# Patient Record
Sex: Female | Born: 2000
Health system: Southern US, Community
[De-identification: ages and names within clinical notes are randomized; demographics above are authoritative.]

## PROBLEM LIST (undated history)

## (undated) DIAGNOSIS — F419 Anxiety disorder, unspecified: Secondary | ICD-10-CM

## (undated) DIAGNOSIS — R011 Cardiac murmur, unspecified: Secondary | ICD-10-CM

## (undated) DIAGNOSIS — K2970 Gastritis, unspecified, without bleeding: Secondary | ICD-10-CM

## (undated) HISTORY — DX: Anxiety disorder, unspecified: F41.9

## (undated) HISTORY — DX: Gastritis, unspecified, without bleeding: K29.70

## (undated) HISTORY — PX: TONSILLECTOMY: SUR1361

---
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
## 2020-01-08 ENCOUNTER — Ambulatory Visit (INDEPENDENT_AMBULATORY_CARE_PROVIDER_SITE_OTHER): Payer: 59 | Admitting: Advanced Practice Midwife

## 2020-01-08 ENCOUNTER — Encounter: Payer: Self-pay | Admitting: Advanced Practice Midwife

## 2020-01-08 ENCOUNTER — Other Ambulatory Visit: Payer: Self-pay

## 2020-01-08 VITALS — BP 113/59 | HR 78 | Wt 160.0 lb

## 2020-01-08 DIAGNOSIS — Z331 Pregnant state, incidental: Secondary | ICD-10-CM

## 2020-01-08 DIAGNOSIS — Z3403 Encounter for supervision of normal first pregnancy, third trimester: Secondary | ICD-10-CM

## 2020-01-08 DIAGNOSIS — F1290 Cannabis use, unspecified, uncomplicated: Secondary | ICD-10-CM

## 2020-01-08 DIAGNOSIS — Z3A30 30 weeks gestation of pregnancy: Secondary | ICD-10-CM

## 2020-01-08 DIAGNOSIS — Z1389 Encounter for screening for other disorder: Secondary | ICD-10-CM

## 2020-01-08 LAB — POCT URINALYSIS DIPSTICK OB
Blood, UA: NEGATIVE
Glucose, UA: NEGATIVE
Ketones, UA: NEGATIVE
Leukocytes, UA: NEGATIVE
Nitrite, UA: NEGATIVE
POC,PROTEIN,UA: NEGATIVE

## 2020-01-08 NOTE — Patient Instructions (Signed)
Latasha Thompson, I greatly value your feedback.  If you receive a survey following your visit with [place] today, we appreciate you taking the time to fill it out.  Thanks, Philipp Deputy, CNM   [facility] ([address] [place], [place] [address]) Entrance C, located off of [address] valet parking  Go to [email or web address] to register for FREE online childbirth classes   Call the office [phone]) or go to [facility] if:  You begin to have strong, frequent contractions  Your water breaks.  Sometimes it is a big gush of fluid, sometimes it is just a trickle that keeps getting your panties wet or running down your legs  You have vaginal bleeding.  It is normal to have a small amount of spotting if your cervix was checked.   You don't feel your baby moving like normal.  If you don't, get you something to eat and drink and lay down and focus on feeling your baby move.  You should feel at least 10 movements in 2 hours.  If you don't, you should call the office or go to [facility].    Tdap Vaccine  It is recommended that you get the Tdap vaccine during the third trimester of EACH pregnancy to help protect your baby from getting pertussis (whooping cough)  27-36 weeks is the BEST time to do this so that you can pass the protection on to your baby. During pregnancy is better than after pregnancy, but if you are unable to get it during pregnancy it will be offered at the hospital.   You can get this vaccine with [place], at the health department, your family doctor, or some local pharmacies  Everyone who will be around your baby should also be up-to-date on their vaccines before the baby comes. Adults (who are not pregnant) only need 1 dose of Tdap during adulthood.   [place] Pediatricians/Family Doctors:  [facility] [phone]            [facility] [phone]                 [facility] [phone]  (usually not accepting new patients unless you have family there already, you are always welcome to call and ask)       [facility] Department [phone]       [facility] Pediatricians/Family Doctors:   [facility]: [phone]  [facility]: [phone]  [facility]: [phone]  [facility] Doctors:   [facility]: [phone]   [facility]: [phone]  [facility] Doctors:  [facility]: [phone]   Home Blood Pressure Monitoring for Patients   Your provider has recommended that you check your blood pressure (BP) at least once a week at home. If you do not have a blood pressure cuff at home, one will be provided for you. Contact your provider if you have not received your monitor within 1 week.   Helpful Tips for Accurate Home Blood Pressure Checks  . Don't smoke, exercise, or drink caffeine 30 minutes before checking your BP . Use the restroom before checking your BP (a full bladder can raise your pressure) . Relax in a comfortable upright chair . Feet on the ground . Left arm resting comfortably on a flat surface at the level of your heart . Legs uncrossed . Back supported . Sit quietly and don't talk . Place the cuff on your bare  arm . Adjust snuggly, so that only two fingertips can fit between your skin and the top of the cuff . Check 2 readings separated by at least one minute . Keep a log of your BP readings . For a visual, please reference this diagram: http://ccnc.care/bpdiagram  Provider Name: [facility]     Phone: [phone]  Zone 1: ALL CLEAR  Continue to monitor your symptoms:  . BP reading is less than 140 (top number) or less than 90 (bottom number)  . No right upper stomach pain . No headaches or seeing spots . No feeling nauseated or throwing up . No swelling in face and hands  Zone 2: CAUTION Call your doctor's office for  any of the following:  . BP reading is greater than 140 (top number) or greater than 90 (bottom number)  . Stomach pain under your ribs in the middle or right side . Headaches or seeing spots . Feeling nauseated or throwing up . Swelling in face and hands  Zone 3: EMERGENCY  Seek immediate medical care if you have any of the following:  . BP reading is greater than160 (top number) or greater than 110 (bottom number) . Severe headaches not improving with Tylenol . Serious difficulty catching your breath . Any worsening symptoms from Zone 2   Third Trimester of Pregnancy The third trimester is from week 29 through week 42, months 7 through 9. The third trimester is a time when the fetus is growing rapidly. At the end of the ninth month, the fetus is about 20 inches in length and weighs 6-10 pounds.  BODY CHANGES Your body goes through many changes during pregnancy. The changes vary from woman to woman.   Your weight will continue to increase. You can expect to gain 25-35 pounds (11-16 kg) by the end of the pregnancy.  You may begin to get stretch marks on your hips, abdomen, and breasts.  You may urinate more often because the fetus is moving lower into your pelvis and pressing on your bladder.  You may develop or continue to have heartburn as a result of your pregnancy.  You may develop constipation because certain hormones are causing the muscles that push waste through your intestines to slow down.  You may develop hemorrhoids or swollen, bulging veins (varicose veins).  You may have pelvic pain because of the weight gain and pregnancy hormones relaxing your joints between the bones in your pelvis. Backaches may result from overexertion of the muscles supporting your posture.  You may have changes in your hair. These can include thickening of your hair, rapid growth, and changes in texture. Some women also have hair loss during or after pregnancy, or hair that feels dry or thin.  Your hair will most likely return to normal after your baby is born.  Your breasts will continue to grow and be tender. A yellow discharge may leak from your breasts called colostrum.  Your belly button may stick out.  You may feel short of breath because of your expanding uterus.  You may notice the fetus "dropping," or moving lower in your abdomen.  You may have a bloody mucus discharge. This usually occurs a few days to a week before labor begins.  Your cervix becomes thin and soft (effaced) near your due date. WHAT TO EXPECT AT YOUR PRENATAL EXAMS  You will have prenatal exams every 2 weeks until week 36. Then, you will have weekly prenatal exams. During a routine prenatal visit:  You  will be weighed to make sure you and the fetus are growing normally.  Your blood pressure is taken.  Your abdomen will be measured to track your baby's growth.  The fetal heartbeat will be listened to.  Any test results from the previous visit will be discussed.  You may have a cervical check near your due date to see if you have effaced. At around 36 weeks, your caregiver will check your cervix. At the same time, your caregiver will also perform a test on the secretions of the vaginal tissue. This test is to determine if a type of bacteria, Group B streptococcus, is present. Your caregiver will explain this further. Your caregiver may ask you:  What your birth plan is.  How you are feeling.  If you are feeling the baby move.  If you have had any abnormal symptoms, such as leaking fluid, bleeding, severe headaches, or abdominal cramping.  If you have any questions. Other tests or screenings that may be performed during your third trimester include:  Blood tests that check for low iron levels (anemia).  Fetal testing to check the health, activity level, and growth of the fetus. Testing is done if you have certain medical conditions or if there are problems during the pregnancy. FALSE  LABOR You may feel small, irregular contractions that eventually go away. These are called Braxton Hicks contractions, or false labor. Contractions may last for hours, days, or even weeks before true labor sets in. If contractions come at regular intervals, intensify, or become painful, it is best to be seen by your caregiver.  SIGNS OF LABOR   Menstrual-like cramps.  Contractions that are 5 minutes apart or less.  Contractions that start on the top of the uterus and spread down to the lower abdomen and back.  A sense of increased pelvic pressure or back pain.  A watery or bloody mucus discharge that comes from the vagina. If you have any of these signs before the 37th week of pregnancy, call your caregiver right away. You need to go to the hospital to get checked immediately. HOME CARE INSTRUCTIONS   Avoid all smoking, herbs, alcohol, and unprescribed drugs. These chemicals affect the formation and growth of the baby.  Follow your caregiver's instructions regarding medicine use. There are medicines that are either safe or unsafe to take during pregnancy.  Exercise only as directed by your caregiver. Experiencing uterine cramps is a good sign to stop exercising.  Continue to eat regular, healthy meals.  Wear a good support bra for breast tenderness.  Do not use hot tubs, steam rooms, or saunas.  Wear your seat belt at all times when driving.  Avoid raw meat, uncooked cheese, cat litter boxes, and soil used by cats. These carry germs that can cause birth defects in the baby.  Take your prenatal vitamins.  Try taking a stool softener (if your caregiver approves) if you develop constipation. Eat more high-fiber foods, such as fresh vegetables or fruit and whole grains. Drink plenty of fluids to keep your urine clear or pale yellow.  Take warm sitz baths to soothe any pain or discomfort caused by hemorrhoids. Use hemorrhoid cream if your caregiver approves.  If you develop varicose  veins, wear support hose. Elevate your feet for 15 minutes, 3-4 times a day. Limit salt in your diet.  Avoid heavy lifting, wear low heal shoes, and practice good posture.  Rest a lot with your legs elevated if you have leg cramps or low back  pain.  Visit your dentist if you have not gone during your pregnancy. Use a soft toothbrush to brush your teeth and be gentle when you floss.  A sexual relationship may be continued unless your caregiver directs you otherwise.  Do not travel far distances unless it is absolutely necessary and only with the approval of your caregiver.  Take prenatal classes to understand, practice, and ask questions about the labor and delivery.  Make a trial run to the hospital.  Pack your hospital bag.  Prepare the baby's nursery.  Continue to go to all your prenatal visits as directed by your caregiver. SEEK MEDICAL CARE IF:  You are unsure if you are in labor or if your water has broken.  You have dizziness.  You have mild pelvic cramps, pelvic pressure, or nagging pain in your abdominal area.  You have persistent nausea, vomiting, or diarrhea.  You have a bad smelling vaginal discharge.  You have pain with urination. SEEK IMMEDIATE MEDICAL CARE IF:   You have a fever.  You are leaking fluid from your vagina.  You have spotting or bleeding from your vagina.  You have severe abdominal cramping or pain.  You have rapid weight loss or gain.  You have shortness of breath with chest pain.  You notice sudden or extreme swelling of your face, hands, ankles, feet, or legs.  You have not felt your baby move in over an hour.  You have severe headaches that do not go away with medicine.  You have vision changes. Document Released: 01/16/2001 Document Revised: 01/27/2013 Document Reviewed: 03/25/2012 [employer] Patient Information 2015 [place], [place]. This information is not intended to replace advice given to you by your health care provider. Make  sure you discuss any questions you have with your health care provider.

## 2020-01-08 NOTE — Progress Notes (Signed)
   LOW-RISK PREGNANCY VISIT Patient name: Latasha Thompson MRN [id number]  Date of birth: 2000/07/16 Chief Complaint:   Routine Prenatal Visit  History of Present Illness:   Latasha Thompson is a 19 y.o. G1P0 female at [redacted]w[redacted]d with an Estimated Date of Delivery: 03/17/20 being seen today for ongoing management of a low-risk pregnancy.  Today she reports doing well; has stopped THC use. Contractions: Not present. Vag. Bleeding: None.  Movement: Present. denies leaking of fluid. Review of Systems:   Pertinent items are noted in HPI Denies abnormal vaginal discharge w/ itching/odor/irritation, headaches, visual changes, shortness of breath, chest pain, abdominal pain, severe nausea/vomiting, or problems with urination or bowel movements unless otherwise stated above. Pertinent History Reviewed:  Reviewed past medical,surgical, social, obstetrical and family history.  Reviewed problem list, medications and allergies. Physical Assessment:   Vitals:   01/08/20 0938  BP: (!) 113/59  Pulse: 78  Weight: 160 lb (72.6 kg)  Body mass index is 27.46 kg/m.        Physical Examination:   General appearance: Well appearing, and in no distress  Mental status: Alert, oriented to person, place, and time  Skin: Warm & dry  Cardiovascular: Normal heart rate noted  Respiratory: Normal respiratory effort, no distress  Abdomen: Soft, gravid, nontender  Pelvic: Cervical exam deferred         Extremities: Edema: None  Fetal Status: Fetal Heart Rate (bpm): 140 Fundal Height: 30 cm Movement: Present    Results for orders placed or performed in visit on 01/08/20 (from the past 24 hour(s))  POC Urinalysis Dipstick OB   Collection Time: 01/08/20  9:39 AM  Result Value Ref Range   Color, UA     Clarity, UA     Glucose, UA Negative Negative   Bilirubin, UA     Ketones, UA neg    Spec Grav, UA     Blood, UA neg    pH, UA     POC,PROTEIN,UA Negative Negative, Trace, Small (1+), Moderate (2+), Large (3+), 4+    Urobilinogen, UA     Nitrite, UA neg    Leukocytes, UA Negative Negative   Appearance     Odor      Assessment & Plan:  1) Low-risk pregnancy G1P0 at [redacted]w[redacted]d with an Estimated Date of Delivery: 03/17/20   2) Hx THC use, has stopped; will get UDS today    Meds: No orders of the defined types were placed in this encounter.  Labs/procedures today: UDS  Plan:  Continue routine obstetrical care   Reviewed: Preterm labor symptoms and general obstetric precautions including but not limited to vaginal bleeding, contractions, leaking of fluid and fetal movement were reviewed in detail with the patient.  All questions were answered. Has home bp cuff.  Check bp weekly, let us know if >140/90.   Follow-up: Return in about 2 weeks (around 01/22/2020) for LROB, in person.  Orders Placed This Encounter  Procedures  . Pain Management Screening Profile (10S)  . POC Urinalysis Dipstick OB   Arabella Merles [facility] 01/08/2020 10:04 AM

## 2020-01-13 LAB — PMP SCREEN PROFILE (10S), URINE
Amphetamine Scrn, Ur: NEGATIVE ng/mL
BARBITURATE SCREEN URINE: NEGATIVE ng/mL
BENZODIAZEPINE SCREEN, URINE: NEGATIVE ng/mL
CANNABINOIDS UR QL SCN: NEGATIVE ng/mL
Cocaine (Metab) Scrn, Ur: NEGATIVE ng/mL
Creatinine(Crt), U: 29.1 mg/dL (ref 20.0–300.0)
Methadone Screen, Urine: NEGATIVE ng/mL
OXYCODONE+OXYMORPHONE UR QL SCN: NEGATIVE ng/mL
Opiate Scrn, Ur: NEGATIVE ng/mL
Ph of Urine: 8 (ref 4.5–8.9)
Phencyclidine Qn, Ur: NEGATIVE ng/mL
Propoxyphene Scrn, Ur: NEGATIVE ng/mL

## 2020-01-21 ENCOUNTER — Other Ambulatory Visit: Payer: Self-pay

## 2020-01-21 ENCOUNTER — Encounter: Payer: Self-pay | Admitting: Obstetrics & Gynecology

## 2020-01-21 ENCOUNTER — Ambulatory Visit (INDEPENDENT_AMBULATORY_CARE_PROVIDER_SITE_OTHER): Payer: 59 | Admitting: Obstetrics & Gynecology

## 2020-01-21 VITALS — BP 103/55 | HR 71 | Wt 160.0 lb

## 2020-01-21 DIAGNOSIS — Z3403 Encounter for supervision of normal first pregnancy, third trimester: Secondary | ICD-10-CM

## 2020-01-21 DIAGNOSIS — O365931 Maternal care for other known or suspected poor fetal growth, third trimester, fetus 1: Secondary | ICD-10-CM

## 2020-01-21 DIAGNOSIS — Z331 Pregnant state, incidental: Secondary | ICD-10-CM

## 2020-01-21 DIAGNOSIS — Z3A32 32 weeks gestation of pregnancy: Secondary | ICD-10-CM

## 2020-01-21 DIAGNOSIS — Z1389 Encounter for screening for other disorder: Secondary | ICD-10-CM

## 2020-01-21 LAB — POCT URINALYSIS DIPSTICK OB
Blood, UA: NEGATIVE
Glucose, UA: NEGATIVE
Ketones, UA: NEGATIVE
Leukocytes, UA: NEGATIVE
Nitrite, UA: NEGATIVE
POC,PROTEIN,UA: NEGATIVE

## 2020-01-21 NOTE — Progress Notes (Signed)
LOW-RISK PREGNANCY VISIT Patient name: Latasha Thompson MRN [id number]  Date of birth: 2000-07-29 Chief Complaint:   Routine Prenatal Visit  History of Present Illness:   Latasha Thompson is a 19 y.o. G1P0 female at [redacted]w[redacted]d with an Estimated Date of Delivery: 03/17/20 being seen today for ongoing management of a low-risk pregnancy.  Depression screen [facility] 2/9 12/18/2019 09/08/2019 07/14/2019  Decreased Interest 1 2 0  Down, Depressed, Hopeless 0 0 0  PHQ - 2 Score 1 2 0  Altered sleeping 0 0 1  Tired, decreased energy 0 1 1  Change in appetite 0 0 2  Feeling bad or failure about yourself  0 0 0  Trouble concentrating 0 0 0  Moving slowly or fidgety/restless 0 0 0  Suicidal thoughts 0 0 0  PHQ-9 Score 1 3 4   Difficult doing work/chores - - Not difficult at all    Today she reports no complaints. Contractions: Not present. Vag. Bleeding: None.  Movement: Present. denies leaking of fluid. Review of Systems:   Pertinent items are noted in HPI Denies abnormal vaginal discharge w/ itching/odor/irritation, headaches, visual changes, shortness of breath, chest pain, abdominal pain, severe nausea/vomiting, or problems with urination or bowel movements unless otherwise stated above. Pertinent History Reviewed:  Reviewed past medical,surgical, social, obstetrical and family history.  Reviewed problem list, medications and allergies. Physical Assessment:   Vitals:   01/21/20 1110  BP: (!) 103/55  Pulse: 71  Weight: 160 lb (72.6 kg)  Body mass index is 27.46 kg/m.        Physical Examination:   General appearance: Well appearing, and in no distress  Mental status: Alert, oriented to person, place, and time  Skin: Warm & dry  Cardiovascular: Normal heart rate noted  Respiratory: Normal respiratory effort, no distress  Abdomen: Soft, gravid, nontender  Pelvic: Cervical exam deferred         Extremities: Edema: None  Fetal Status: Fetal Heart Rate (bpm): 140 Fundal Height: 28 cm Movement:  Present    Chaperone: Amanda Rash    Results for orders placed or performed in visit on 01/21/20 (from the past 24 hour(s))  POC Urinalysis Dipstick OB   Collection Time: 01/21/20 11:15 AM  Result Value Ref Range   Color, UA     Clarity, UA     Glucose, UA Negative Negative   Bilirubin, UA     Ketones, UA neg    Spec Grav, UA     Blood, UA neg    pH, UA     POC,PROTEIN,UA Negative Negative, Trace, Small (1+), Moderate (2+), Large (3+), 4+   Urobilinogen, UA     Nitrite, UA neg    Leukocytes, UA Negative Negative   Appearance     Odor      Assessment & Plan:  1) Low-risk pregnancy G1P0 at [redacted]w[redacted]d with an Estimated Date of Delivery: 03/17/20   2) Check sonogram next visit for small for gestational age,    Meds: No orders of the defined types were placed in this encounter.  Labs/procedures today:   Plan:  Continue routine obstetrical care  Next visit: prefers in person    Reviewed: Preterm labor symptoms and general obstetric precautions including but not limited to vaginal bleeding, contractions, leaking of fluid and fetal movement were reviewed in detail with the patient.  All questions were answered. has home bp cuff. Rx faxed to . Check bp weekly, let 05/15/20 know if >140/90.   Follow-up: Return in about  2 weeks (around 02/04/2020) for sonogram for EFW, LROB.  Orders Placed This Encounter  Procedures  . US OB Follow Up  . POC Urinalysis Dipstick OB    Lazaro Arms, MD 01/21/2020 11:45 AM

## 2020-02-06 NOTE — L&D Delivery Note (Addendum)
OB/GYN Faculty Practice Delivery Note  Latasha Thompson is a 20 y.o. G1P1001 s/p vaginal delivery at [redacted]w[redacted]d. She was admitted for labor management s/p PROM.   ROM: 17h 48m with light meconium stained fluid GBS Status: negative Maximum Maternal Temperature: 99.29F  Labor Progress: Given no cervical change s/p admission to L&D, pt received cytotec and FB was placed. She was transitioned to pitocin at 0421 on 2/13. At 0645 an IUPC was placed and amnioinfusion was started for recurrent deep variables. She then progressed to complete cervical dilation and had an uncomplicated vaginal delivery as noted below.   Delivery Date/Time: 03/21/20 at 0754 Delivery: Called to room and patient was complete and pushing. Head delivered ROA. No nuchal cord present. Shoulder and body delivered in usual fashion. Infant with spontaneous cry, placed on mother's abdomen, dried and stimulated. Cord clamped x 2 after 1-minute delay, and cut by family member under my direct supervision. Cord blood drawn. Placenta delivered spontaneously with gentle cord traction. Fundus firm with massage and Pitocin. Labia, perineum, vagina, and cervix were inspected, notable for second degree perineal laceration s/p repair in standard fashion with 3-0 vicryl in addition to bilateral hemostatic periurethral lacerations.  Placenta: 3-vessel cord, intact with trailing membranes, sent to L&D Complications: none Lacerations: 2nd degree EBL: 150 ml Analgesia: epidural  Infant: viable female  APGARs 8 & 9  weight 3116 g  I was gloved and present for delivery of infant as noted above. I delivered the placenta and performed the laceration repair as noted above.  Lynnda Shields, MD OB/GYN Fellow, Faculty Practice

## 2020-02-08 ENCOUNTER — Other Ambulatory Visit: Payer: 59

## 2020-02-08 ENCOUNTER — Encounter: Payer: 59 | Admitting: Obstetrics & Gynecology

## 2020-02-17 ENCOUNTER — Ambulatory Visit (INDEPENDENT_AMBULATORY_CARE_PROVIDER_SITE_OTHER): Payer: Medicaid Other

## 2020-02-17 ENCOUNTER — Other Ambulatory Visit: Payer: Self-pay

## 2020-02-17 ENCOUNTER — Ambulatory Visit (INDEPENDENT_AMBULATORY_CARE_PROVIDER_SITE_OTHER): Payer: Medicaid Other | Admitting: Advanced Practice Midwife

## 2020-02-17 VITALS — BP 105/62 | HR 80 | Wt 167.0 lb

## 2020-02-17 DIAGNOSIS — Z3403 Encounter for supervision of normal first pregnancy, third trimester: Secondary | ICD-10-CM

## 2020-02-17 DIAGNOSIS — O365931 Maternal care for other known or suspected poor fetal growth, third trimester, fetus 1: Secondary | ICD-10-CM

## 2020-02-17 DIAGNOSIS — Z3A35 35 weeks gestation of pregnancy: Secondary | ICD-10-CM | POA: Diagnosis not present

## 2020-02-17 DIAGNOSIS — Z1389 Encounter for screening for other disorder: Secondary | ICD-10-CM

## 2020-02-17 DIAGNOSIS — Z331 Pregnant state, incidental: Secondary | ICD-10-CM

## 2020-02-17 LAB — POCT URINALYSIS DIPSTICK OB
Blood, UA: NEGATIVE
Glucose, UA: NEGATIVE
Ketones, UA: NEGATIVE
Leukocytes, UA: NEGATIVE
Nitrite, UA: NEGATIVE
POC,PROTEIN,UA: NEGATIVE

## 2020-02-17 NOTE — Progress Notes (Signed)
   PRENATAL VISIT NOTE  Subjective:  Latasha Thompson is a 20 y.o. G1P0 at [redacted]w[redacted]d being seen today for ongoing prenatal care.  She is currently monitored for the following issues for this low-risk pregnancy and has Nausea and vomiting; Supervision of normal first pregnancy; Marijuana use; and Asymptomatic bacteriuria on their problem list.  Patient reports no complaints.  Contractions: Not present. Vag. Bleeding: None.  Movement: Present. Denies leaking of fluid.   The following portions of the patient's history were reviewed and updated as appropriate: allergies, current medications, past family history, past medical history, past social history, past surgical history and problem list. Problem list updated.  Objective:   Vitals:   02/17/20 1508  BP: 105/62  Pulse: 80  Weight: 167 lb (75.8 kg)    Fetal Status: Fetal Heart Rate (bpm): 120 Fundal Height: 34 cm Movement: Present     General:  Alert, oriented and cooperative. Patient is in no acute distress.  Skin: Skin is warm and dry. No rash noted.   Cardiovascular: Normal heart rate noted  Respiratory: Normal respiratory effort, no problems with respiration noted  Abdomen: Soft, gravid, appropriate for gestational age.  Pain/Pressure: Absent     Pelvic: Cervical exam deferred        Extremities: Normal range of motion.  Edema: None  Mental Status: Normal mood and affect. Normal behavior. Normal judgment and thought content.   Assessment and Plan:  Pregnancy: G1P0 at [redacted]w[redacted]d  1. Encounter for supervision of normal first pregnancy in third trimester - Routine care - GBS and GCC next visit. Reviewed indication for treatment in labor, possible influence on inpatient care  2. Small for dates affecting management of mother, third trimester, fetus 1 - S/p ultrasound today, EFW 2391g (14.5%)  Preterm labor symptoms and general obstetric precautions including but not limited to vaginal bleeding, contractions, leaking of fluid and fetal  movement were reviewed in detail with the patient. Please refer to After Visit Summary for other counseling recommendations.  Return in about 1 week (around 02/24/2020) for 36 week swabs.  No future appointments.  Calvert Cantor, CNM

## 2020-02-17 NOTE — Patient Instructions (Signed)

## 2020-02-17 NOTE — Progress Notes (Signed)
[place] 35+6 wks,cephalic,anterior placenta gr 3,afi 11.2 cm,fhr 138 bpm,EFW 2391 g 14.5%,FL 2.3%

## 2020-02-24 ENCOUNTER — Encounter: Payer: Self-pay | Admitting: Obstetrics and Gynecology

## 2020-02-24 ENCOUNTER — Other Ambulatory Visit: Payer: Self-pay

## 2020-02-24 ENCOUNTER — Ambulatory Visit (INDEPENDENT_AMBULATORY_CARE_PROVIDER_SITE_OTHER): Payer: Medicaid Other | Admitting: Obstetrics and Gynecology

## 2020-02-24 ENCOUNTER — Other Ambulatory Visit (HOSPITAL_COMMUNITY)
Admission: RE | Admit: 2020-02-24 | Discharge: 2020-02-24 | Disposition: A | Discharge status: Home / Self Care | Payer: Medicaid Other | Source: Ambulatory Visit | Attending: Obstetrics and Gynecology | Admitting: Obstetrics and Gynecology

## 2020-02-24 VITALS — BP 109/55 | HR 95 | Wt 168.8 lb

## 2020-02-24 DIAGNOSIS — Z113 Encounter for screening for infections with a predominantly sexual mode of transmission: Secondary | ICD-10-CM | POA: Insufficient documentation

## 2020-02-24 DIAGNOSIS — Z3403 Encounter for supervision of normal first pregnancy, third trimester: Secondary | ICD-10-CM

## 2020-02-24 DIAGNOSIS — Z3A36 36 weeks gestation of pregnancy: Secondary | ICD-10-CM | POA: Insufficient documentation

## 2020-02-24 LAB — OB RESULTS CONSOLE GC/CHLAMYDIA: Gonorrhea: NEGATIVE

## 2020-02-24 NOTE — Patient Instructions (Signed)

## 2020-02-24 NOTE — Progress Notes (Signed)
Subjective:  Latasha Thompson is a 20 y.o. G1P0 at [redacted]w[redacted]d being seen today for ongoing prenatal care.  She is currently monitored for the following issues for this low-risk pregnancy and has Nausea and vomiting; Supervision of normal first pregnancy; Marijuana use; and Asymptomatic bacteriuria on their problem list.  Patient reports general discomforts of pregnancy.  Contractions: Not present. Vag. Bleeding: None.  Movement: Present. Denies leaking of fluid.   The following portions of the patient's history were reviewed and updated as appropriate: allergies, current medications, past family history, past medical history, past social history, past surgical history and problem list. Problem list updated.  Objective:   Vitals:   02/24/20 1429  BP: (!) 109/55  Pulse: 95  Weight: 168 lb 12.8 oz (76.6 kg)    Fetal Status:     Movement: Present     General:  Alert, oriented and cooperative. Patient is in no acute distress.  Skin: Skin is warm and dry. No rash noted.   Cardiovascular: Normal heart rate noted  Respiratory: Normal respiratory effort, no problems with respiration noted  Abdomen: Soft, gravid, appropriate for gestational age. Pain/Pressure: Absent     Pelvic:  Cervical exam performed        Extremities: Normal range of motion.  Edema: None  Mental Status: Normal mood and affect. Normal behavior. Normal judgment and thought content.   Urinalysis:      Assessment and Plan:  Pregnancy: G1P0 at [redacted]w[redacted]d  1. [redacted] weeks gestation of pregnancy  - Culture, beta strep (group b only) - Cervicovaginal ancillary only( [place])  2. Encounter for supervision of normal first pregnancy in third trimester Stable Vaginal cultures Labor precautions  Preterm labor symptoms and general obstetric precautions including but not limited to vaginal bleeding, contractions, leaking of fluid and fetal movement were reviewed in detail with the patient. Please refer to After Visit Summary for other  counseling recommendations.  Return in about 1 week (around 03/02/2020) for OB visit, virtual, any provider.   Hermina Staggers, MD

## 2020-02-26 LAB — CERVICOVAGINAL ANCILLARY ONLY
Chlamydia: NEGATIVE
Comment: NEGATIVE
Comment: NORMAL
Neisseria Gonorrhea: NEGATIVE

## 2020-02-28 LAB — CULTURE, BETA STREP (GROUP B ONLY): Strep Gp B Culture: NEGATIVE

## 2020-03-08 ENCOUNTER — Telehealth: Payer: Self-pay

## 2020-03-08 NOTE — Telephone Encounter (Signed)
Pt called to r/s appt for an earlier time due to pelvic pressure, had experienced some back pain in the last few day

## 2020-03-08 NOTE — Telephone Encounter (Signed)
Returned pt's call. Pt was experiencing increased pressure in her lower pelvic, vaginal area. She has no c/o pain, no contractions, baby is moving as usual with no decreased movements. No bleeding or spotting, no rupture of membranes or leaking fluid. Will be seen in the office 2/2 at 0850. Assured pt that this was normal as the baby moves down into the pelvis and she noticed a significant lowering of her belly. She wants to have her cervix checked for dilation at her appt. She was told that if she had any rupture of membranes, leaking fluid, or timeable contractions, to go to the hospital.

## 2020-03-09 ENCOUNTER — Other Ambulatory Visit: Payer: Self-pay

## 2020-03-09 ENCOUNTER — Encounter: Payer: Medicaid Other | Admitting: Women's Health

## 2020-03-09 ENCOUNTER — Ambulatory Visit (INDEPENDENT_AMBULATORY_CARE_PROVIDER_SITE_OTHER): Payer: Medicaid Other | Admitting: Advanced Practice Midwife

## 2020-03-09 ENCOUNTER — Encounter: Payer: Self-pay | Admitting: Advanced Practice Midwife

## 2020-03-09 VITALS — BP 109/61 | HR 75 | Wt 169.0 lb

## 2020-03-09 DIAGNOSIS — Z3403 Encounter for supervision of normal first pregnancy, third trimester: Secondary | ICD-10-CM

## 2020-03-09 DIAGNOSIS — Z3A38 38 weeks gestation of pregnancy: Secondary | ICD-10-CM

## 2020-03-09 NOTE — Patient Instructions (Signed)

## 2020-03-09 NOTE — Progress Notes (Signed)
   LOW-RISK PREGNANCY VISIT Patient name: Latasha Thompson MRN [id number]  Date of birth: 2001-01-24 Chief Complaint:   Routine Prenatal Visit  History of Present Illness:   Latasha Thompson is a 20 y.o. G1P0 female at [redacted]w[redacted]d with an Estimated Date of Delivery: 03/17/20 being seen today for ongoing management of a low-risk pregnancy.  Today she reports dong well but ready!. Contractions: Not present.  .  Movement: Present. denies leaking of fluid. Review of Systems:   Pertinent items are noted in HPI Denies abnormal vaginal discharge w/ itching/odor/irritation, headaches, visual changes, shortness of breath, chest pain, abdominal pain, severe nausea/vomiting, or problems with urination or bowel movements unless otherwise stated above. Pertinent History Reviewed:  Reviewed past medical,surgical, social, obstetrical and family history.  Reviewed problem list, medications and allergies. Physical Assessment:   Vitals:   03/09/20 0908  BP: 109/61  Pulse: 75  Weight: 169 lb (76.7 kg)  Body mass index is 29.01 kg/m.        Physical Examination:   General appearance: Well appearing, and in no distress  Mental status: Alert, oriented to person, place, and time  Skin: Warm & dry  Cardiovascular: Normal heart rate noted  Respiratory: Normal respiratory effort, no distress  Abdomen: Soft, gravid, nontender  Pelvic: Cervical exam deferred         Extremities: Edema: None  Fetal Status:     Movement: Present    No results found for this or any previous visit (from the past 24 hour(s)).  Assessment & Plan:  1) Low-risk pregnancy G1P0 at [redacted]w[redacted]d with an Estimated Date of Delivery: 03/17/20     Meds: No orders of the defined types were placed in this encounter.  Labs/procedures today: none  Plan:  Continue routine obstetrical care; NST with visit at 40.1wks if no labor, then will schedule IOL for 41.0wks   Reviewed: Term labor symptoms and general obstetric precautions including but not limited  to vaginal bleeding, contractions, leaking of fluid and fetal movement were reviewed in detail with the patient.  All questions were answered. Didn't ask about home bp cuff. Check bp weekly, let us know if >140/90.   Follow-up: Return in 9 days (on 03/18/2020) for LROB, NST, in person.  No orders of the defined types were placed in this encounter.  Arabella Merles CNM 03/09/2020 9:37 AM

## 2020-03-18 ENCOUNTER — Other Ambulatory Visit: Payer: Self-pay

## 2020-03-18 ENCOUNTER — Encounter: Payer: Medicaid Other | Admitting: Obstetrics & Gynecology

## 2020-03-18 ENCOUNTER — Encounter: Payer: Self-pay | Admitting: Obstetrics & Gynecology

## 2020-03-18 ENCOUNTER — Other Ambulatory Visit: Payer: Self-pay | Admitting: Advanced Practice Midwife

## 2020-03-18 ENCOUNTER — Ambulatory Visit (INDEPENDENT_AMBULATORY_CARE_PROVIDER_SITE_OTHER): Payer: Medicaid Other | Admitting: Obstetrics & Gynecology

## 2020-03-18 VITALS — BP 109/65 | HR 80 | Wt 172.0 lb

## 2020-03-18 DIAGNOSIS — Z3A40 40 weeks gestation of pregnancy: Secondary | ICD-10-CM | POA: Diagnosis not present

## 2020-03-18 DIAGNOSIS — O480 Post-term pregnancy: Secondary | ICD-10-CM | POA: Diagnosis not present

## 2020-03-18 NOTE — Progress Notes (Signed)
   LOW-RISK PREGNANCY VISIT Patient name: Latasha Thompson MRN [id number]  Date of birth: February 01, 2001 Chief Complaint:   Routine Prenatal Visit and Non-stress Test  History of Present Illness:   Latasha Thompson is a 20 y.o. G1P0 female at 104w1d with an Estimated Date of Delivery: 03/17/20 being seen today for ongoing management of a low-risk pregnancy.  Depression screen [facility] 2/9 12/18/2019 09/08/2019 07/14/2019  Decreased Interest 1 2 0  Down, Depressed, Hopeless 0 0 0  PHQ - 2 Score 1 2 0  Altered sleeping 0 0 1  Tired, decreased energy 0 1 1  Change in appetite 0 0 2  Feeling bad or failure about yourself  0 0 0  Trouble concentrating 0 0 0  Moving slowly or fidgety/restless 0 0 0  Suicidal thoughts 0 0 0  PHQ-9 Score 1 3 4   Difficult doing work/chores - - Not difficult at all    Today she reports no complaints. Contractions: Not present. Vag. Bleeding: None.  Movement: Present. denies leaking of fluid. Review of Systems:   Pertinent items are noted in HPI Denies abnormal vaginal discharge w/ itching/odor/irritation, headaches, visual changes, shortness of breath, chest pain, abdominal pain, severe nausea/vomiting, or problems with urination or bowel movements unless otherwise stated above. Pertinent History Reviewed:  Reviewed past medical,surgical, social, obstetrical and family history.  Reviewed problem list, medications and allergies. Physical Assessment:   Vitals:   03/18/20 1225  BP: 109/65  Pulse: 80  Weight: 172 lb (78 kg)  Body mass index is 29.52 kg/m.        Physical Examination:   General appearance: Well appearing, and in no distress  Mental status: Alert, oriented to person, place, and time  Skin: Warm & dry  Cardiovascular: Normal heart rate noted  Respiratory: Normal respiratory effort, no distress  Abdomen: Soft, gravid, nontender  Pelvic: Cervical exam deferred         Extremities: Edema: None  Fetal Status:     Movement: Present    Chaperone: N/A   No  results found for this or any previous visit (from the past 24 hour(s)).  Assessment & Plan:  1) Low-risk pregnancy G1P0 at [redacted]w[redacted]d with an Estimated Date of Delivery: 03/17/20   2) Impending post dates, IOL 2/26@2345    Meds: No orders of the defined types were placed in this encounter.  Labs/procedures today: nst   VICKYE ASTORINO is at [redacted]w[redacted]d Estimated Date of Delivery: 03/17/20  NST being performed due to post dates  Today the NST is Reactive  Fetal Monitoring:  Baseline: 135 bpm, Variability: Good {> 6 bpm), Accelerations: Reactive and Decelerations: Absent   reactive  The accelerations are >15 bpm and more than 2 in 20 minutes  Final diagnosis:  Reactive NST  05/15/20, MD     Plan:  Continue routine obstetrical care  Next visit: prefers in person    Reviewed: Term labor symptoms and general obstetric precautions including but not limited to vaginal bleeding, contractions, leaking of fluid and fetal movement were reviewed in detail with the patient.  All questions were answered. Has home bp cuff. Rx faxed to . Check bp weekly, let Lazaro Arms know if >140/90.   Follow-up: No follow-ups on file.  No future appointments.  No orders of the defined types were placed in this encounter.  [place]  03/18/2020 1:01 PM

## 2020-03-18 NOTE — Progress Notes (Signed)
   Induction Assessment Scheduling Form: Fax to [facility]:  [phone]  Latasha Thompson                                                                                   DOB:  2000-10-29                                                            MRN:  [id number]                                                                     Phone #:   demographics                         Provider:  [facility]  GP:  G1P0                                                            Estimated Date of Delivery: 03/17/20  Dating Criteria: 1st trimester scan    Medical Indications for induction:  Post dates Admission Date/Time:  03/23/20@2345  Gestational age on admission:  [redacted]w[redacted]d   Filed Weights   03/18/20 1225  Weight: 172 lb (78 kg)   HIV:  Non Reactive (11/12 0839) GBS: Negative/-- (01/19 1600)  Cervical exam was deferred   Method of induction(proposed):  Cytotec/foley/pitocin   Scheduling Provider Signature:  Lazaro Arms, MD                                            Today's Date:  03/18/2020

## 2020-03-20 ENCOUNTER — Encounter (HOSPITAL_COMMUNITY): Payer: Self-pay | Admitting: Family Medicine

## 2020-03-20 ENCOUNTER — Inpatient Hospital Stay (HOSPITAL_COMMUNITY)
Admission: AD | Admit: 2020-03-20 | Discharge: 2020-03-23 | DRG: 806 | Disposition: A | Discharge status: Home / Self Care | Payer: Medicaid Other | Attending: Family Medicine | Admitting: Family Medicine

## 2020-03-20 ENCOUNTER — Other Ambulatory Visit: Payer: Self-pay

## 2020-03-20 DIAGNOSIS — Z20822 Contact with and (suspected) exposure to covid-19: Secondary | ICD-10-CM | POA: Diagnosis present

## 2020-03-20 DIAGNOSIS — Z3A40 40 weeks gestation of pregnancy: Secondary | ICD-10-CM | POA: Diagnosis not present

## 2020-03-20 DIAGNOSIS — Z3400 Encounter for supervision of normal first pregnancy, unspecified trimester: Secondary | ICD-10-CM

## 2020-03-20 DIAGNOSIS — F1290 Cannabis use, unspecified, uncomplicated: Secondary | ICD-10-CM | POA: Diagnosis present

## 2020-03-20 DIAGNOSIS — Z3403 Encounter for supervision of normal first pregnancy, third trimester: Secondary | ICD-10-CM

## 2020-03-20 DIAGNOSIS — O480 Post-term pregnancy: Secondary | ICD-10-CM | POA: Diagnosis present

## 2020-03-20 DIAGNOSIS — R8271 Bacteriuria: Secondary | ICD-10-CM | POA: Diagnosis present

## 2020-03-20 DIAGNOSIS — O4202 Full-term premature rupture of membranes, onset of labor within 24 hours of rupture: Secondary | ICD-10-CM | POA: Diagnosis not present

## 2020-03-20 DIAGNOSIS — O4292 Full-term premature rupture of membranes, unspecified as to length of time between rupture and onset of labor: Principal | ICD-10-CM | POA: Diagnosis present

## 2020-03-20 DIAGNOSIS — O99324 Drug use complicating childbirth: Secondary | ICD-10-CM | POA: Diagnosis present

## 2020-03-20 LAB — RESP PANEL BY RT-PCR (FLU A&B, COVID) ARPGX2
Influenza A by PCR: NEGATIVE
Influenza B by PCR: NEGATIVE
SARS Coronavirus 2 by RT PCR: NEGATIVE

## 2020-03-20 LAB — CBC
HCT: 34.6 % — ABNORMAL LOW (ref 36.0–46.0)
Hemoglobin: 11 g/dL — ABNORMAL LOW (ref 12.0–15.0)
MCH: 28.6 pg (ref 26.0–34.0)
MCHC: 31.8 g/dL (ref 30.0–36.0)
MCV: 89.9 fL (ref 80.0–100.0)
Platelets: 236 10*3/uL (ref 150–400)
RBC: 3.85 MIL/uL — ABNORMAL LOW (ref 3.87–5.11)
RDW: 14 % (ref 11.5–15.5)
WBC: 7.5 10*3/uL (ref 4.0–10.5)
nRBC: 0 % (ref 0.0–0.2)

## 2020-03-20 LAB — TYPE AND SCREEN
ABO/RH(D): A POS
Antibody Screen: NEGATIVE

## 2020-03-20 LAB — POCT FERN TEST: POCT Fern Test: POSITIVE

## 2020-03-20 MED ORDER — OXYCODONE-ACETAMINOPHEN 5-325 MG PO TABS
2.0000 | ORAL_TABLET | ORAL | Status: DC | PRN
Start: 2020-03-20 — End: 2020-03-21

## 2020-03-20 MED ORDER — OXYTOCIN-SODIUM CHLORIDE 30-0.9 UT/500ML-% IV SOLN: 2.5000 [IU]/h | INTRAVENOUS | Status: DC

## 2020-03-20 MED ORDER — MISOPROSTOL 50MCG HALF TABLET
50.0000 ug | ORAL_TABLET | Freq: Once | ORAL | Status: AC
  Administered 2020-03-20: 50 ug via BUCCAL
  Filled 2020-03-20: qty 1

## 2020-03-20 MED ORDER — OXYTOCIN BOLUS FROM INFUSION
333.0000 mL | Freq: Once | INTRAVENOUS | Status: AC
  Administered 2020-03-21: 333 mL/h via INTRAVENOUS

## 2020-03-20 MED ORDER — EPHEDRINE 5 MG/ML INJ: 10.0000 mg | INTRAVENOUS | Status: DC | PRN

## 2020-03-20 MED ORDER — FENTANYL-BUPIVACAINE-NACL 0.5-0.125-0.9 MG/250ML-% EP SOLN
12.0000 mL/h | EPIDURAL | Status: DC | PRN
Start: 2020-03-20 — End: 2020-03-21
  Filled 2020-03-20: qty 250

## 2020-03-20 MED ORDER — ACETAMINOPHEN 325 MG PO TABS: 650.0000 mg | ORAL_TABLET | ORAL | Status: DC | PRN

## 2020-03-20 MED ORDER — FENTANYL CITRATE (PF) 100 MCG/2ML IJ SOLN
50.0000 ug | INTRAMUSCULAR | Status: DC | PRN
  Administered 2020-03-21 (×2): 100 ug via INTRAVENOUS
  Filled 2020-03-20 (×2): qty 2

## 2020-03-20 MED ORDER — OXYTOCIN-SODIUM CHLORIDE 30-0.9 UT/500ML-% IV SOLN
1.0000 m[IU]/min | INTRAVENOUS | Status: DC
  Administered 2020-03-21: 2 m[IU]/min via INTRAVENOUS
  Filled 2020-03-20: qty 500

## 2020-03-20 MED ORDER — PHENYLEPHRINE 40 MCG/ML (10ML) SYRINGE FOR IV PUSH (FOR BLOOD PRESSURE SUPPORT)
80.0000 ug | PREFILLED_SYRINGE | INTRAVENOUS | Status: DC | PRN
  Administered 2020-03-21: 80 ug via INTRAVENOUS
  Filled 2020-03-20: qty 10

## 2020-03-20 MED ORDER — TERBUTALINE SULFATE 1 MG/ML IJ SOLN: 0.2500 mg | Freq: Once | INTRAMUSCULAR | Status: DC | PRN

## 2020-03-20 MED ORDER — BUTORPHANOL TARTRATE 1 MG/ML IJ SOLN
2.0000 mg | Freq: Once | INTRAMUSCULAR | Status: AC
  Administered 2020-03-20: 2 mg via INTRAVENOUS
  Filled 2020-03-20: qty 2

## 2020-03-20 MED ORDER — OXYCODONE-ACETAMINOPHEN 5-325 MG PO TABS
1.0000 | ORAL_TABLET | ORAL | Status: DC | PRN
Start: 2020-03-20 — End: 2020-03-21

## 2020-03-20 MED ORDER — MISOPROSTOL 25 MCG QUARTER TABLET: 25.0000 ug | ORAL_TABLET | ORAL | Status: DC | PRN

## 2020-03-20 MED ORDER — LACTATED RINGERS IV SOLN
500.0000 mL | Freq: Once | INTRAVENOUS | Status: AC
  Administered 2020-03-21: 500 mL via INTRAVENOUS

## 2020-03-20 MED ORDER — LACTATED RINGERS IV SOLN: INTRAVENOUS | Status: DC

## 2020-03-20 MED ORDER — LACTATED RINGERS IV SOLN: 500.0000 mL | INTRAVENOUS | Status: DC | PRN

## 2020-03-20 MED ORDER — DIPHENHYDRAMINE HCL 50 MG/ML IJ SOLN: 12.5000 mg | INTRAMUSCULAR | Status: DC | PRN

## 2020-03-20 MED ORDER — ONDANSETRON HCL 4 MG/2ML IJ SOLN: 4.0000 mg | Freq: Four times a day (QID) | INTRAMUSCULAR | Status: DC | PRN

## 2020-03-20 MED ORDER — PHENYLEPHRINE 40 MCG/ML (10ML) SYRINGE FOR IV PUSH (FOR BLOOD PRESSURE SUPPORT): 80.0000 ug | PREFILLED_SYRINGE | INTRAVENOUS | Status: DC | PRN

## 2020-03-20 MED ORDER — LIDOCAINE HCL (PF) 1 % IJ SOLN: 30.0000 mL | INTRAMUSCULAR | Status: DC | PRN

## 2020-03-20 MED ORDER — SOD CITRATE-CITRIC ACID 500-334 MG/5ML PO SOLN: 30.0000 mL | ORAL | Status: DC | PRN

## 2020-03-20 NOTE — Progress Notes (Addendum)
Latasha Thompson is a 20 y.o. G1P0 at [redacted]w[redacted]d admitted for PROM @1400  on 2/13.    Subjective: Feeling contractions, but not very painful yet.  Discussed FB insertion and she is agreeable.    Objective: BP 123/64   Pulse 70   Temp 97.6 F (36.4 C) (Oral)   Resp 18   LMP 06/11/2019   SpO2 100%  No intake/output data recorded.  FHT:  FHR: 135 bpm, variability: moderate,  accelerations:  Absent,  decelerations:  Absent UC: Every 2-4 min  SVE:   Dilation: 1 Effacement (%): Thick Station: -2 Exam by:: Shekela Goodridge MD  Labs: Lab Results  Component Value Date   WBC 7.5 03/20/2020   HGB 11.0 (L) 03/20/2020   HCT 34.6 (L) 03/20/2020   MCV 89.9 03/20/2020   PLT 236 03/20/2020    Assessment / Plan: Latasha Thompson is a 20 y.o. G1P0 at [redacted]w[redacted]d here for PROM @1400  on 2/13.  #PROM:  Cervix unchanged this check.  Attempted FB insertion, but did not tolerate.  After dose of Stadol, attempted again, but still unsuccessful (very posterior and did not tolerate further attempts).  Given a dose of Cytotec 50 mcg buccally.  #Pain: Upon patient request #FWB: Cat I #ID: [id number] #MOF: Breast #MOC: POPs #Circ: Yes #THC use in preg: SW consult pp  , MD 03/20/2020, 11:47 PM

## 2020-03-20 NOTE — Progress Notes (Signed)
CNM at bedside to confirm presentation. Pt informed that the ultrasound is considered a limited OB ultrasound and is not intended to be a complete ultrasound exam.  Patient also informed that the ultrasound is not being completed with the intent of assessing for fetal or placental anomalies or any pelvic abnormalities.  Explained that the purpose of today's ultrasound is to assess for  presentation.  Patient acknowledges the purpose of the exam and the limitations of the study.    Vertex presentation confirmed.   Rolm Bookbinder, CNM 03/20/20 3:58 PM

## 2020-03-20 NOTE — MAU Note (Signed)
Latasha Thompson is a 20 y.o. at [redacted]w[redacted]d here in MAU reporting: has been leaking since 1400. Still leaking. Fluid is yellow. No bleeding or contractions. +FM  Onset of complaint: today  Pain score: 0/10  Vitals:   03/20/20 1529  BP: 127/65  Pulse: 69  Resp: 16  Temp: 99.2 F (37.3 C)  SpO2: 100%     FHT:+FM  Lab orders placed from triage: none

## 2020-03-20 NOTE — H&P (Signed)
OBSTETRIC ADMISSION HISTORY AND PHYSICAL  Latasha Thompson is a 20 y.o. female G1P0 with IUP at [redacted]w[redacted]d by LMP presenting for PROM @ 1400 on 03/20/20. She reports +FMs, No LOF, no VB, no blurry vision, headaches or peripheral edema, and RUQ pain.  She plans on breast feeding. She requests POPs for birth control. She received her prenatal care at [facility]   Dating: By LMP --->  Estimated Date of Delivery: 03/17/20  Sono:    02/17/20@[redacted]w[redacted]d , CWD, normal anatomy, cephalic presentation,  Anterior placenta, 2391g, 14.5% EFW   Prenatal History/Complications:  Teen pregnancy THC use in pregnancy  Past Medical History: Past Medical History:  Diagnosis Date  . Heart murmur of newborn     Past Surgical History: Past Surgical History:  Procedure Laterality Date  . BIOPSY  05/05/2019   Procedure: BIOPSY;  Surgeon: West Bali, MD;  [facility];  Service: Endoscopy;;  . ESOPHAGOGASTRODUODENOSCOPY (EGD) WITH PROPOFOL N/A 05/05/2019   Procedure: ESOPHAGOGASTRODUODENOSCOPY (EGD) WITH PROPOFOL;  Surgeon: West Bali, MD;  [facility];  Service: Endoscopy;  Laterality: N/A;  2:30pm  . SAVORY DILATION N/A 05/05/2019   Procedure: SAVORY DILATION;  Surgeon: West Bali, MD;  [facility];  Service: Endoscopy;  Laterality: N/A;  . TONSILLECTOMY      Obstetrical History: OB History    Gravida  1   Para      Term      Preterm      AB      Living        SAB      IAB      Ectopic      Multiple      Live Births              Social History Social History   Socioeconomic History  . Marital status: Single    Spouse name: Not on file  . Number of children: Not on file  . Years of education: Not on file  . Highest education level: Not on file  Occupational History  . 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 Smoking status: Never Smoker  . Smokeless tobacco: Never Used  Vaping Use  . Vaping Use: Never  used  Substance and Sexual Activity  . Alcohol use: Never  . Drug use: Not Currently    Types: Marijuana  . Sexual activity: Yes    Birth control/protection: None  Other Topics Concern  . Not on file  Social History Narrative  . Not on file   Social Determinants of Health   Financial Resource Strain: Low Risk   . Difficulty of Paying Living Expenses: Not hard at all  Food Insecurity: No Food Insecurity  . Worried About Programme researcher, broadcasting/film/video in the Last Year: Never true  . Ran Out of Food in the Last Year: Never true  Transportation Needs: No Transportation Needs  . Lack of Transportation (Medical): No  . Lack of Transportation (Non-Medical): No  Physical Activity: Inactive  . Days of Exercise per Week: 0 days  . Minutes of Exercise per Session: 0 min  Stress: No Stress Concern Present  . Feeling of Stress : Not at all  Social Connections: Socially Isolated  . Frequency of Communication with Friends and Family: Twice a week  . Frequency of Social Gatherings with Friends and Family: Never  . Attends Religious Services: 1 to 4 times per year  .  Active Member of Clubs or Organizations: No  . Attends Banker Meetings: Never  . Marital Status: Never married    Family History: Family History  Problem Relation Age of Onset  . Aneurysm Maternal Grandmother   . Colon cancer Neg Hx   . Colon polyps Neg Hx     Allergies: No Known Allergies  Pt denies allergies to latex, iodine, or shellfish.  Medications Prior to Admission  Medication Sig Dispense Refill Last Dose  . ferrous sulfate 325 (65 FE) MG tablet Take 1 tablet (325 mg total) by mouth 2 (two) times daily with a meal. 60 tablet 3   . Prenatal Vit-Fe Fumarate-FA (PRENATAL VITAMIN PO) Take by mouth.        Review of Systems   All systems reviewed and negative except as stated in HPI  Blood pressure 111/63, pulse 64, temperature 99.2 F (37.3 C), temperature source Oral, resp. rate 18, last menstrual  period 06/11/2019, SpO2 100 %. General appearance: alert, cooperative and no distress Lungs: normal respiratory effort Heart: regular rate and rhythm Abdomen: soft, non-tender; gravid Pelvic: as noted below Extremities: Homans sign is negative, no sign of DVT Presentation: cephalicby my exam and BSUS in MAU Fetal monitoringBaseline: 130 bpm, Variability: Good {> 6 bpm), Accelerations: Reactive and Decelerations: Absent Uterine activityFrequency: Every 1-5 minutes Dilation: 1 Effacement (%): Thick Station: -2 Exam by:: Dr. Paige/Dr. Germaine Pomfret   Prenatal labs: ABO, Rh: --/--/A POS (02/13 1620) Antibody: NEG (02/13 1620) Rubella: 8.61 (08/03 1150) RPR: Non Reactive (11/12 0839)  HBsAg: Negative (08/03 1150)  HIV: Non Reactive (11/12 0839)  GBS: Negative/-- (01/19 1600)  2 hr Glucola: passed Genetic screening:  normal Anatomy US: normal  Prenatal Transfer Tool  Maternal Diabetes: No Genetic Screening: Normal Maternal Ultrasounds/Referrals: Normal Fetal Ultrasounds or other Referrals:  None Maternal Substance Abuse:  Yes:  Type: Marijuana Significant Maternal Medications:  None Significant Maternal Lab Results: Group B Strep negative  Results for orders placed or performed during the hospital encounter of 03/20/20 (from the past 24 hour(s))  Resp Panel by RT-PCR (Flu A&B, Covid) Nasopharyngeal Swab   Collection Time: 03/20/20  4:05 PM   Specimen: Nasopharyngeal Swab; Nasopharyngeal(NP) swabs in vial transport medium  Result Value Ref Range   SARS Coronavirus 2 by RT PCR NEGATIVE NEGATIVE   Influenza A by PCR NEGATIVE NEGATIVE   Influenza B by PCR NEGATIVE NEGATIVE  CBC   Collection Time: 03/20/20  4:20 PM  Result Value Ref Range   WBC 7.5 4.0 - 10.5 K/uL   RBC 3.85 (L) 3.87 - 5.11 MIL/uL   Hemoglobin 11.0 (L) 12.0 - 15.0 g/dL   HCT 93.7 (L) 16.9 - 67.8 %   MCV 89.9 80.0 - 100.0 fL   MCH 28.6 26.0 - 34.0 pg   MCHC 31.8 30.0 - 36.0 g/dL   RDW 93.8 10.1 - 75.1 %    Platelets 236 150 - 400 K/uL   nRBC 0.0 0.0 - 0.2 %  Type and screen   Collection Time: 03/20/20  4:20 PM  Result Value Ref Range   ABO/RH(D) A POS    Antibody Screen NEG    Sample Expiration      03/23/2020,2359 Performed at [facility], [address] [place], [place] [address]   Crist Fat Test   Collection Time: 03/20/20  4:39 PM  Result Value Ref Range   POCT Fern Test Positive = ruptured amniotic membanes     Patient Active Problem List   Diagnosis  Date Noted  . Post-dates pregnancy 03/20/2020  . Asymptomatic bacteriuria 09/11/2019  . Marijuana use 09/10/2019  . Supervision of normal first pregnancy 09/08/2019  . Nausea and vomiting 04/30/2019    Assessment/Plan:  Latasha Thompson is a 20 y.o. G1P0 at [redacted]w[redacted]d here for PROM @1400 .  #PROM: Patient 1cm, contracting every 1-5 minutes, will recheck in 2-4 hours and if unchanged will initiate augmentation. Discussed IOL process with patient. #Pain: PRN #FWB: Cat 1 #ID: GBS neg #MOF: breast #MOC: POPs #Circ: yes #THC use in preg: SW consult pp  , MD  Center for [facility], [facility] 03/20/2020, 5:54 PM

## 2020-03-21 ENCOUNTER — Inpatient Hospital Stay (HOSPITAL_COMMUNITY): Payer: Medicaid Other | Admitting: Anesthesiology

## 2020-03-21 ENCOUNTER — Encounter (HOSPITAL_COMMUNITY): Payer: Self-pay | Admitting: Obstetrics & Gynecology

## 2020-03-21 DIAGNOSIS — Z3A40 40 weeks gestation of pregnancy: Secondary | ICD-10-CM

## 2020-03-21 DIAGNOSIS — O4202 Full-term premature rupture of membranes, onset of labor within 24 hours of rupture: Secondary | ICD-10-CM

## 2020-03-21 LAB — RPR: RPR Ser Ql: NONREACTIVE

## 2020-03-21 MED ORDER — BUTORPHANOL TARTRATE 1 MG/ML IJ SOLN
2.0000 mg | Freq: Once | INTRAMUSCULAR | Status: AC
  Administered 2020-03-21: 2 mg via INTRAVENOUS
  Filled 2020-03-21: qty 2

## 2020-03-21 MED ORDER — LIDOCAINE HCL (PF) 1 % IJ SOLN
INTRAMUSCULAR | Status: DC | PRN
  Administered 2020-03-21: 5 mL via EPIDURAL

## 2020-03-21 MED ORDER — SENNOSIDES-DOCUSATE SODIUM 8.6-50 MG PO TABS
2.0000 | ORAL_TABLET | ORAL | Status: DC
  Administered 2020-03-21 – 2020-03-22 (×2): 2 via ORAL
  Filled 2020-03-21 (×2): qty 2

## 2020-03-21 MED ORDER — MEASLES, MUMPS & RUBELLA VAC IJ SOLR: 0.5000 mL | Freq: Once | INTRAMUSCULAR | Status: DC

## 2020-03-21 MED ORDER — ACETAMINOPHEN 325 MG PO TABS
650.0000 mg | ORAL_TABLET | ORAL | Status: DC | PRN
  Administered 2020-03-21 – 2020-03-23 (×4): 650 mg via ORAL
  Filled 2020-03-21 (×4): qty 2

## 2020-03-21 MED ORDER — COCONUT OIL OIL: 1.0000 "application " | TOPICAL_OIL | Status: DC | PRN

## 2020-03-21 MED ORDER — FENTANYL-BUPIVACAINE-NACL 0.5-0.125-0.9 MG/250ML-% EP SOLN
EPIDURAL | Status: DC | PRN
  Administered 2020-03-21: 12 mL/h via EPIDURAL

## 2020-03-21 MED ORDER — MISOPROSTOL 50MCG HALF TABLET: 50.0000 ug | ORAL_TABLET | ORAL | Status: DC | PRN

## 2020-03-21 MED ORDER — DIPHENHYDRAMINE HCL 25 MG PO CAPS: 25.0000 mg | ORAL_CAPSULE | Freq: Four times a day (QID) | ORAL | Status: DC | PRN

## 2020-03-21 MED ORDER — WITCH HAZEL-GLYCERIN EX PADS: 1.0000 "application " | MEDICATED_PAD | CUTANEOUS | Status: DC | PRN

## 2020-03-21 MED ORDER — TETANUS-DIPHTH-ACELL PERTUSSIS 5-2.5-18.5 LF-MCG/0.5 IM SUSY: 0.5000 mL | PREFILLED_SYRINGE | Freq: Once | INTRAMUSCULAR | Status: DC

## 2020-03-21 MED ORDER — SIMETHICONE 80 MG PO CHEW: 80.0000 mg | CHEWABLE_TABLET | ORAL | Status: DC | PRN

## 2020-03-21 MED ORDER — PRENATAL MULTIVITAMIN CH
1.0000 | ORAL_TABLET | Freq: Every day | ORAL | Status: DC
  Administered 2020-03-21 – 2020-03-22 (×2): 1 via ORAL
  Filled 2020-03-21 (×2): qty 1

## 2020-03-21 MED ORDER — PROMETHAZINE HCL 25 MG/ML IJ SOLN
12.5000 mg | Freq: Four times a day (QID) | INTRAMUSCULAR | Status: DC | PRN
  Administered 2020-03-21: 12.5 mg via INTRAVENOUS
  Filled 2020-03-21: qty 1

## 2020-03-21 MED ORDER — LACTATED RINGERS AMNIOINFUSION: INTRAVENOUS | Status: DC

## 2020-03-21 MED ORDER — DIBUCAINE (PERIANAL) 1 % EX OINT: 1.0000 "application " | TOPICAL_OINTMENT | CUTANEOUS | Status: DC | PRN

## 2020-03-21 MED ORDER — ONDANSETRON HCL 4 MG/2ML IJ SOLN: 4.0000 mg | INTRAMUSCULAR | Status: DC | PRN

## 2020-03-21 MED ORDER — ONDANSETRON HCL 4 MG PO TABS: 4.0000 mg | ORAL_TABLET | ORAL | Status: DC | PRN

## 2020-03-21 MED ORDER — IBUPROFEN 600 MG PO TABS
600.0000 mg | ORAL_TABLET | Freq: Four times a day (QID) | ORAL | Status: DC
  Administered 2020-03-21 – 2020-03-23 (×7): 600 mg via ORAL
  Filled 2020-03-21 (×8): qty 1

## 2020-03-21 MED ORDER — BENZOCAINE-MENTHOL 20-0.5 % EX AERO
1.0000 "application " | INHALATION_SPRAY | CUTANEOUS | Status: DC | PRN
  Administered 2020-03-21: 1 via TOPICAL
  Filled 2020-03-21: qty 56

## 2020-03-21 NOTE — Progress Notes (Addendum)
Latasha Thompson is a 20 y.o. G1P0 at [redacted]w[redacted]d admitted for PROM @1400  on 2/13.    Subjective: Feeling more intense contractions.  Got a dose of Fentanyl, but not helping with pain as much as Stadol did.  No other concerns.   Objective: BP (!) 113/54   Pulse 60   Temp 98.2 F (36.8 C) (Oral)   Resp 18   LMP 06/11/2019   SpO2 100%  No intake/output data recorded.  FHT:  FHR: 125 bpm, variability: moderate,  accelerations:  Present,  decelerations:  Absent UC:   Irregular q1-5 min  SVE:   Dilation: 1.5 Effacement (%): 80 Station: -3 Exam by:: [email or web address], MD   Labs: Lab Results  Component Value Date   WBC 7.5 03/20/2020   HGB 11.0 (L) 03/20/2020   HCT 34.6 (L) 03/20/2020   MCV 89.9 03/20/2020   PLT 236 03/20/2020    Assessment / Plan: Latasha Brunelli Johnsonis a 20 y.o.G1P0 at [redacted]w[redacted]d here for PROM @1400  on 2/13.  #PROM:  S/p Cytotec x1 at 2225.  FB (60 cc) inserted this check.  Was initially going to give a second dose of Cytotec, but contractions became more consistent.  Therefore, defer 2nd dose of Cytotec and start low dose pit.  Will not exceed 6 mu/min while FB in place. #Pain:Upon patient request.  Stadol 2 mg + phenergan 12.5 mg now.   #FWB:Cat I #ID:GBS neg #MOF:Breast #MOC:POPs #Circ:Yes #THC use in preg: SW consult pp  3/13, MD 03/21/2020, 2:58 AM

## 2020-03-21 NOTE — Discharge Summary (Signed)
Postpartum Discharge Summary      Patient Name: Latasha Thompson DOB: 01/01/2001 MRN: [id number]  Date of admission: 03/20/2020 Delivery date:03/21/2020  Delivering provider: Lenna Sciara  Date of discharge: 03/23/2020  Admitting diagnosis: Post-dates pregnancy [O48.0] Intrauterine pregnancy: [redacted]w[redacted]d    Secondary diagnosis:  Active Problems:   Supervision of normal first pregnancy   Marijuana use   Asymptomatic bacteriuria   Post-dates pregnancy   Vaginal delivery  Additional problems: none    Discharge diagnosis: Term Pregnancy Delivered                                              Post partum procedures:none Augmentation: Pitocin, Cytotec and IP Foley Complications: None  Hospital course: Onset of Labor With Vaginal Delivery      20y.o. yo G1P0 at 423w4das admitted in Latent Labor with PROM on 03/20/2020. Patient presented after PROM 2/13 _0 . Patient had an uncomplicated labor course as follows:  Membrane Rupture Time/Date: 2:00 PM ,03/20/2020   Delivery Method:Vaginal, Spontaneous  Episiotomy: None  Lacerations:  Periurethral;2nd degree  Patient had an uncomplicated postpartum course.  She is ambulating, tolerating a regular diet, passing flatus, and urinating well. Patient is discharged home in stable condition on 03/23/20.  Newborn Data: Birth date:03/21/2020  Birth time:7:54 AM  Gender:Female  Living status:Living  Apgars:8 ,9  Weight:3116 g   Magnesium Sulfate received: No BMZ received: No Rhophylac:N/A MMR:N/A Transfusion:No  Physical exam  Vitals:   03/22/20 0508 03/22/20 1449 03/22/20 2139 03/23/20 0600  BP: (!) 109/47 (!) 102/57 (!) 107/58 120/81  Pulse: 67 65 61 62  Resp: _1 Temp: 98.3 F (36.8 C) 98.3 F (36.8 C) 98.2 F (36.8 C) 98.1 F (36.7 C)  TempSrc: Oral Oral Oral Oral  SpO2: 99% 100% 100% 100%   General: alert, cooperative and no distress Lochia: appropriate Uterine Fundus: firm Incision: N/A DVT Evaluation: No  evidence of DVT seen on physical exam. Labs: Lab Results  Component Value Date   WBC 7.5 03/20/2020   HGB 11.0 (L) 03/20/2020   HCT 34.6 (L) 03/20/2020   MCV 89.9 03/20/2020   PLT 236 03/20/2020   CMP Latest Ref Rng & Units 07/20/2019  Glucose 70 - 99 mg/dL 114(H)  BUN 6 - 20 mg/dL 5(L)  Creatinine 0.44 - 1.00 mg/dL 0.74  Sodium 135 - 145 mmol/L 136  Potassium 3.5 - 5.1 mmol/L 2.9(L)  Chloride 98 - 111 mmol/L 103  CO2 22 - 32 mmol/L 22  Calcium 8.9 - 10.3 mg/dL 9.2  Total Protein 6.5 - 8.1 g/dL 7.3  Total Bilirubin 0.3 - 1.2 mg/dL 0.5  Alkaline Phos 38 - 126 U/L 50  AST 15 - 41 U/L 13(L)  ALT 0 - 44 U/L 11   Edinburgh Score: Edinburgh Postnatal Depression Scale Screening Tool 03/22/2020  I have been able to laugh and see the funny side of things. 0  I have looked forward with enjoyment to things. 0  I have blamed myself unnecessarily when things went wrong. 0  I have been anxious or worried for no good reason. 0  I have felt scared or panicky for no good reason. 0  Things have been getting on top of me. 0  I have been so unhappy that I have had difficulty sleeping. 0  I have felt sad or  miserable. 0  I have been so unhappy that I have been crying. 0  The thought of harming myself has occurred to me. 0  Edinburgh Postnatal Depression Scale Total 0     After visit meds:  Allergies as of 03/23/2020   No Known Allergies     Medication List    STOP taking these medications   ferrous sulfate 325 (65 FE) MG tablet     TAKE these medications   prenatal multivitamin Tabs tablet Take 1 tablet by mouth daily at 12 noon.        Discharge home in stable condition Infant Feeding: Breast Infant Disposition:home with mother Discharge instruction: per After Visit Summary and Postpartum booklet. Activity: Advance as tolerated. Pelvic rest for 6 weeks.  Diet: routine diet Future Appointments: Future Appointments  Date Time Provider [place]  04/29/2020 12:10 PM  Myrtis Ser, CNM [facility]   Follow up Visit: Message sent to [facility] 03/21/20 by Sylvester Harder.   Please schedule this patient for a In person postpartum visit in 4 weeks with the following provider: Any provider. Additional Postpartum F/U:none  Low risk pregnancy complicated by: n/a Delivery mode:  Vaginal, Spontaneous  Anticipated Birth Control:  POPs, pt wants to defer starting until 4-6 wk PP visit   03/23/2020 Desma Maxim, MD

## 2020-03-21 NOTE — Anesthesia Postprocedure Evaluation (Signed)
Anesthesia Post Note  Patient: Latasha Thompson  Procedure(s) Performed: AN AD HOC LABOR EPIDURAL     Patient location during evaluation: Mother Baby Anesthesia Type: Epidural Level of consciousness: awake and alert Pain management: pain level controlled Vital Signs Assessment: post-procedure vital signs reviewed and stable Respiratory status: spontaneous breathing, nonlabored ventilation and respiratory function stable Cardiovascular status: stable Postop Assessment: no headache, no backache, epidural receding, no apparent nausea or vomiting, patient able to bend at knees, adequate PO intake and able to ambulate Anesthetic complications: no   No complications documented.  Last Vitals:  Vitals:   03/21/20 1132 03/21/20 1532  BP: (!) 105/41 (!) 115/53  Pulse: 65 74  Resp: 17 18  Temp: 36.9 C 36.9 C  SpO2:      Last Pain:  Vitals:   03/21/20 1533  TempSrc:   PainSc: 3    Pain Goal:                   [employer]

## 2020-03-21 NOTE — Progress Notes (Signed)
Latasha Thompson is a 20 y.o. G1P0 at [redacted]w[redacted]d admitted for PROM @1400  on 2/13.    Subjective: Contractions were much more intense, so got epidural and now much more comfortable.  No concerns.    Objective: BP 116/61   Pulse 78   Temp 98.4 F (36.9 C) (Oral)   Resp 18   LMP 06/11/2019   SpO2 100%  No intake/output data recorded.  FHT:  FHR: 125 bpm, variability: moderate,  accelerations:  Present,  decelerations:  Absent UC:   Irregular q1-5 min  SVE:   Dilation: 7 Effacement (%): 90 Station: 0 Exam by:: Dr. [email or web address]  Pitocin @ 6 mu/min  Labs: Lab Results  Component Value Date   WBC 7.5 03/20/2020   HGB 11.0 (L) 03/20/2020   HCT 34.6 (L) 03/20/2020   MCV 89.9 03/20/2020   PLT 236 03/20/2020    Assessment / Plan: Latasha Laningham Johnsonis a 20 y.o.G1P0 at [redacted]w[redacted]d here for PROM @1400  on 2/13.  #PROM:  S/p Cytotec x1 at 2225.  S/p FB.  Pitocin started at 0421.  After getting epidural, was having recurrent deep variable decels down to 80s, some prolonged.  No improvement with position changes and IV fluid bolus.  Still with moderate variability and accels.  IUPC placed and started amnioinfusion (300 cc bolus, then 100 cc/hr) and given dose of phenylephrine for mild hypotension.  Still with some recurrent decels, now some late, so will stop pitocin for now.  If decels resolve, will restart pit in 15 minutes. #Pain:Epidural in place #FWB:Cat II as above.  Variability and accels are reassuring, but will take the above steps to improve FHT.   #ID:GBS neg #MOF:Breast #MOC:POPs #Circ:Yes #THC use in preg: SW consult pp  3/13, MD 03/21/2020, 6:56 AM

## 2020-03-21 NOTE — Discharge Instructions (Signed)

## 2020-03-21 NOTE — Anesthesia Procedure Notes (Signed)
Epidural Patient location during procedure: OB Start time: 03/21/2020 5:57 AM End time: 03/21/2020 6:10 AM  Staffing Anesthesiologist: Trevor Iha, MD Performed: anesthesiologist   Preanesthetic Checklist Completed: patient identified, IV checked, site marked, risks and benefits discussed, surgical consent, monitors and equipment checked, pre-op evaluation and timeout performed  Epidural Patient position: sitting Prep: DuraPrep and site prepped and draped Patient monitoring: continuous pulse ox and blood pressure Approach: midline Location: L3-L4 Injection technique: LOR air  Needle:  Needle type: Tuohy  Needle gauge: 17 G Needle length: 9 cm and 9 Needle insertion depth: 7 cm Catheter type: closed end flexible Catheter size: 19 Gauge Catheter at skin depth: 12 cm Test dose: negative  Assessment Events: blood not aspirated, injection not painful, no injection resistance, no paresthesia and negative IV test  Additional Notes Patient identified. Risks/Benefits/Options discussed with patient including but not limited to bleeding, infection, nerve damage, paralysis, failed block, incomplete pain control, headache, blood pressure changes, nausea, vomiting, reactions to medication both or allergic, itching and postpartum back pain. Confirmed with bedside nurse the patient's most recent platelet count. Confirmed with patient that they are not currently taking any anticoagulation, have any bleeding history or any family history of bleeding disorders. Patient expressed understanding and wished to proceed. All questions were answered. Sterile technique was used throughout the entire procedure. Please see nursing notes for vital signs. Test dose was given through epidural needle and negative prior to continuing to dose epidural or start infusion. Warning signs of high block given to the patient including shortness of breath, tingling/numbness in hands, complete motor block, or any  concerning symptoms with instructions to call for help. Patient was given instructions on fall risk and not to get out of bed. All questions and concerns addressed with instructions to call with any issues. 1  Attempt (S) . Patient tolerated procedure well.

## 2020-03-21 NOTE — Anesthesia Preprocedure Evaluation (Signed)
Anesthesia Evaluation  Patient identified by MRN, date of birth, ID band Patient awake    Reviewed: Allergy & Precautions, NPO status , Patient's Chart, lab work & pertinent test results  Airway Mallampati: II  TM Distance: >3 FB Neck ROM: Full    Dental no notable dental hx. (+) Teeth Intact, Dental Advisory Given   Pulmonary neg pulmonary ROS,    Pulmonary exam normal breath sounds clear to auscultation       Cardiovascular Exercise Tolerance: Good Normal cardiovascular exam Rhythm:Regular Rate:Normal     Neuro/Psych negative neurological ROS     GI/Hepatic negative GI ROS, Neg liver ROS,   Endo/Other  negative endocrine ROS  Renal/GU negative Renal ROS     Musculoskeletal negative musculoskeletal ROS (+)   Abdominal   Peds  Hematology Lab Results      Component                Value               Date                      WBC                      7.5                 03/20/2020                HGB                      11.0 (L)            03/20/2020                HCT                      34.6 (L)            03/20/2020                MCV                      89.9                03/20/2020                PLT                      236                 03/20/2020              Anesthesia Other Findings   Reproductive/Obstetrics (+) Pregnancy                             Anesthesia Physical Anesthesia Plan  ASA: II  Anesthesia Plan: Epidural   Post-op Pain Management:    Induction:   PONV Risk Score and Plan:   Airway Management Planned:   Additional Equipment:   Intra-op Plan:   Post-operative Plan:   Informed Consent:   Plan Discussed with:   Anesthesia Plan Comments: (40.5 G1P0 for LEA)       Anesthesia Quick Evaluation

## 2020-03-22 NOTE — Clinical Social Work Maternal (Signed)
CLINICAL SOCIAL WORK MATERNAL/CHILD NOTE  Patient Details  Name: Latasha Thompson MRN: [id number] Date of Birth: 2000-06-25  Date:  03/22/2020  Clinical Social Worker Initiating Note:  Darra Lis, [place] Date/Time: Initiated:  03/22/20/0930     Child's Name:  Latasha Thompson   Biological Parents:  Mother,Father Latasha Thompson 08/31/2000)   Need for Interpreter:  None   Reason for Referral:  Current Substance Use/Substance Use During Pregnancy    Address:  [address] [place] [address]    Phone number:  [phone] (home)     Additional phone number:   Household Members/Support Persons (HM/SP):   Household Member/Support Person 1   HM/SP Name Relationship DOB or Age  HM/SP -1 Demontez [place] Thompson Significant Other 08/31/2000  HM/SP -2        HM/SP -3        HM/SP -4        HM/SP -5        HM/SP -6        HM/SP -7        HM/SP -8          Natural Supports (not living in the home):  Immediate Family   Professional Supports: None   Employment: Unemployed   Type of Work:     Education:  Programmer, systems   Homebound arranged:    Museum/gallery curator Resources:  Medicaid   Other Resources:  Physicist, medical ,[place] Considerations Which May Impact Care:    Strengths:  Ability to meet basic needs ,Pediatrician chosen,Home prepared for child    Psychotropic Medications:         Pediatrician:    [place] (including Technical sales engineer and surronding areas)  Pediatrician List:   [place]      Pediatrician Fax Number:    Risk Factors/Current Problems:  Substance Use    Cognitive State:  Alert ,Insightful ,Goal Oriented    Mood/Affect:  Happy ,Calm ,Interested ,Comfortable    CSW Assessment: CSW consulted for [facility] use during pregnancy. CSW met with MOB to assess and offer support. When CSW arrived, FOB was observed leaving the  room to complete the birth certificate. CSW introduced self and role. MOB was open and pleasant during assessment. CSW informed MOB of reason for consult. MOB was understanding. MOB reported she used THC prior to knowing she was pregnant, and denies use THC or other substances during the pregnancy. CSW informed MOB of the hospital drug screen policy. MOB was informed a CPS report will be made if baby test positive for substances. MOB was understanding.  CSW reported she lives with FOB. MOB receives both [facility] and food stamp resources. MOB denies any mental health diagnosis and stated she had a good pregnancy. MOB denies any current SI, HI or DV and stated she is currently feeling good.  CSW provided education regarding the baby blues period vs. perinatal mood disorders, discussed treatment and gave resources for mental health follow up if concerns arise.  CSW recommends self-evaluation during the postpartum time period using the New Mom Checklist from Postpartum Progress and encouraged MOB to contact a medical professional if symptoms are noted at any time.   CSW provided review of Sudden Infant Death Syndrome (SIDS) precautions.  MOB reported she has all essential needs for baby, including a bassinet. MOB has identified a pediatrician  and denies any barriers to follow-up care. MOB denies having any additional needs at this time.   CSW will continue to follow UDS/CDS and make a CPS report if warranted. CSW identifies no further need for intervention and no barriers to discharge at this time.  CSW Plan/Description:  No Further Intervention Required/No Barriers to [place] Will Continue to Monitor Umbilical Cord Tissue Drug Screen Results and Make Report if Warranted,Child Protective Service Report ,Hospital Drug Screen Policy Information,Perinatal Mood and Anxiety Disorder (PMADs) Education,Sudden Infant Death Syndrome (SIDS) Education,Other Information/Referral to [employer], [place] 03/22/2020, 10:22 AM

## 2020-03-22 NOTE — Lactation Note (Signed)
This note was copied from a baby's chart. Lactation Consultation Note  Patient Name: Latasha Thompson URKYH'C Date: 03/22/2020 Reason for consult: Initial assessment;1st time breastfeeding;Term Age:20 hours P1, term female infant. Per mom, most feedings today are 30 to 40 minutes in length, infant been breastfeeding on both breast when BF. Infant had one void since his circumcision today and been sleepy.  LC discussed BF infant STS, and doing hand expression to wake infant or if infant not latching. LC discussed hand expression and mom taught back infant was given 5 mls of colostrum by spoon and afterwards started cuing to BF. Mom latched infant on her left breast using the cross cradle hold infant latched with depth and was still BF after 27 minutes when LC left the room. Mom doesn't have breast pump at home, [facility] gave mom hand pump and explained how to use, mom will use hand pump and give infant back extra volume after latching infant at breast due elevated billi and help stabilize weight.  Mom knows she can give infant her EBM that is hand expressed or pump by spoon or curve tip syringe. Mom will continue to BF infant according to cues, 8 to 12+ times or more, STS. Mom knows to call RN or LC if she has any questions, concerns or need assistance with latching infant at the breast.  Mom made aware of O/P services, breastfeeding support groups, community resources, and our phone # for post-discharge questions.  Maternal Data Has patient been taught Hand Expression?: Yes Does the patient have breastfeeding experience prior to this delivery?: No  Feeding Mother's Current Feeding Choice: Breast Milk  LATCH Score Latch: Grasps breast easily, tongue down, lips flanged, rhythmical sucking.  Audible Swallowing: Spontaneous and intermittent  Type of Nipple: Everted at rest and after stimulation  Comfort (Breast/Nipple): Soft / non-tender  Hold (Positioning): Assistance needed to correctly  position infant at breast and maintain latch.  LATCH Score: 9   Lactation Tools Discussed/Used Tools: Pump Breast pump type: Manual Pump Education: Setup, frequency, and cleaning;Milk Storage Reason for Pumping: Mom will use hand pump and give infant back extra volume after latching infant at breast. Pumping frequency: occassional for few feedings after latching infant at the breast.  Interventions Interventions: Breast feeding basics reviewed;Assisted with latch;Skin to skin;Breast massage;Hand express;Reverse pressure;Breast compression;Adjust position;Support pillows;Position options;Expressed milk;Hand pump;Education  Discharge Pump: Manual WIC Program: Yes  Consult Status Consult Status: Follow-up Date: 03/23/20 Follow-up type: In-patient    Danelle Earthly 03/22/2020, 7:37 PM

## 2020-03-22 NOTE — Progress Notes (Addendum)
POSTPARTUM PROGRESS NOTE  Subjective: Latasha Thompson is a 20 y.o. G1P1001 s/p vaginal delivery at [redacted]w[redacted]d after presenting for PROM.  She reports she doing well. No acute events overnight. She denies any problems with ambulating, voiding or po intake. Denies nausea or vomiting. Pain is well controlled.  Lochia is about the same as menses.  Objective: Blood pressure (!) 109/47, pulse 67, temperature 98.3 F (36.8 C), temperature source Oral, resp. rate 16, last menstrual period 06/11/2019, SpO2 99 %, unknown if currently breastfeeding.  Physical Exam:  General: alert, cooperative and no distress Chest: no respiratory distress Abdomen: soft, non-tender  Uterine Fundus: firm, appropriately tender Extremities: No calf swelling or tenderness  no edema  Recent Labs    03/20/20 1620  HGB 11.0*  HCT 34.6*    Assessment/Plan: Latasha Thompson is a 20 y.o. G1P1001 s/p vaginal delivery at [redacted]w[redacted]d after presenting for PROM.    Routine postpartum care: Doing well, pain well-controlled.  -Continue routine care, lactation support  -Contraception: POPs -Feeding: Breast -Circumcision: Yes  THC use in pregnancy: -SW consulted  Dispo: Plan for discharge tomorrow.  Gypsy Decant, MD 03/22/2020, 6:29 AM  Attestation of Supervision of Student:  I confirm that I have verified the information documented in the  resident's  note and that I have also personally reperformed the history, physical exam and all medical decision making activities.  I have verified that all services and findings are accurately documented in this student's note; and I agree with management and plan as outlined in the documentation. I have also made any necessary editorial changes.  Sheila Oats, MD [facility], [facility] 03/22/2020 8:54 AM

## 2020-03-23 NOTE — Lactation Note (Signed)
This note was copied from a baby's chart. Lactation Consultation Note  Patient Name: Boy Tyonna Talerico VFIEP'P Date: 03/23/2020 Reason for consult: Follow-up assessment;Primapara;1st time breastfeeding;Term;Infant weight loss;Other (Comment) (6 % weight loss) Age:20 hours  Per mom breast feeding is going well and baby last fed at 7 am for 15 mins with swallows/ and it was comfortable.  Baby awake and hungry, LC offered to check and change the diaper - med wet.  LC offered to assist to latch and mom receptive. Baby opened wide , deep latch  Latch score - 8 - see details below. Baby still feeding at 15 mins.  D/C BF teaching completed - see below.  Mom has the [facility] brochure with resource numbers   Hedwig Morton aware to update total time for feeding.   Maternal Data Has patient been taught Hand Expression?: Yes  Feeding Mother's Current Feeding Choice: Breast Milk  LATCH Score Latch: Grasps breast easily, tongue down, lips flanged, rhythmical sucking.  Audible Swallowing: Spontaneous and intermittent  Type of Nipple: Everted at rest and after stimulation  Comfort (Breast/Nipple): Filling, red/small blisters or bruises, mild/mod discomfort  Hold (Positioning): Assistance needed to correctly position infant at breast and maintain latch.  LATCH Score: 8   Lactation Tools Discussed/Used Tools: Pump;Flanges Flange Size: 24;27 Breast pump type: Manual Pump Education: Milk Storage  Interventions Interventions: Breast feeding basics reviewed;Assisted with latch;Skin to skin;Breast massage;Hand express;Breast compression;Adjust position;Support pillows;Position options;Expressed milk;Hand pump  Discharge Discharge Education: Engorgement and breast care;Warning signs for feeding baby Pump: Manual WIC Program: Yes  Consult Status Consult Status: Complete Date: 03/23/20    Kathrin Greathouse 03/23/2020, 8:42 AM

## 2020-03-24 ENCOUNTER — Inpatient Hospital Stay (HOSPITAL_COMMUNITY)
Admission: RE | Admit: 2020-03-24 | Payer: Medicaid Other | Source: Ambulatory Visit | Admitting: Obstetrics & Gynecology

## 2020-03-24 ENCOUNTER — Inpatient Hospital Stay (HOSPITAL_COMMUNITY): Payer: Medicaid Other

## 2020-03-28 ENCOUNTER — Other Ambulatory Visit: Payer: Self-pay | Admitting: Advanced Practice Midwife

## 2020-03-28 MED ORDER — PNV PRENATAL PLUS MULTIVITAMIN 27-1 MG PO TABS: 1.0000 | ORAL_TABLET | Freq: Every day | ORAL | 11 refills | Status: DC

## 2020-04-29 ENCOUNTER — Ambulatory Visit (INDEPENDENT_AMBULATORY_CARE_PROVIDER_SITE_OTHER): Payer: Medicaid Other | Admitting: Advanced Practice Midwife

## 2020-04-29 ENCOUNTER — Encounter: Payer: Self-pay | Admitting: Advanced Practice Midwife

## 2020-04-29 ENCOUNTER — Other Ambulatory Visit: Payer: Self-pay

## 2020-04-29 MED ORDER — NORETHINDRONE 0.35 MG PO TABS
1.0000 | ORAL_TABLET | Freq: Every day | ORAL | 11 refills | Status: DC
Start: 2020-04-29 — End: 2022-06-08

## 2020-04-29 NOTE — Progress Notes (Signed)
POSTPARTUM VISIT Patient name: Latasha Thompson MRN [id number]  Date of birth: [birth year] Chief Complaint:   Postpartum Care  History of Present Illness:   Latasha GRONEWOLD is a 20 y.o. G48P1001 African American female being seen today for a postpartum visit. She is 5.5 weeks postpartum following a spontaneous vaginal delivery at 40.4 gestational weeks. IOL: Yes, for PROM. Anesthesia: epidural.  Laceration: 2nd degree perineal.  Complications: none. Inpatient contraception: no.   Pregnancy uncomplicated. Tobacco use: no. Substance use disorder: yes ; previously- marijuana at [facility], neg in Dec 2021. Last pap smear: <21yo  Patient's last menstrual period was 06/11/2019.  Postpartum course has been uncomplicated. Bleeding staining only. Bowel function is normal. Bladder function is normal. Urinary incontinence? No, fecal incontinence? No Patient is not sexually active. Last sexual activity: prior to delivery.  Desired contraception: POPs. Patient does not know about a pregnancy in the future.  Desired family size is unsure about the number of children.   Upstream - 04/29/20 1223      Pregnancy Intention Screening   Does the patient want to become pregnant in the next year? No    Does the patient's partner want to become pregnant in the next year? No    Would the patient like to discuss contraceptive options today? No      Contraception Wrap Up   Current Method No Contraceptive Precautions    Contraception Counseling Provided Yes          The pregnancy intention screening data noted above was reviewed. Potential methods of contraception were discussed. The patient elected to proceed with Oral Contraceptive.   Edinburgh Postpartum Depression Screening: Negative  Edinburgh Postnatal Depression Scale - 04/29/20 1221      Edinburgh Postnatal Depression Scale:  In the Past 7 Days   I have been able to laugh and see the funny side of things. 0    I have looked forward with enjoyment to things. 0     I have blamed myself unnecessarily when things went wrong. 0    I have been anxious or worried for no good reason. 1    I have felt scared or panicky for no good reason. 0    Things have been getting on top of me. 0    I have been so unhappy that I have had difficulty sleeping. 0    I have felt sad or miserable. 0    I have been so unhappy that I have been crying. 0    The thought of harming myself has occurred to me. 0    Edinburgh Postnatal Depression Scale Total 1          Baby's course has been uncomplicated. Baby is feeding by breast: milk supply adequate. Infant has a pediatrician/family doctor? Yes.  Childcare strategy if returning to work/school: didn't discuss.  Pt has material needs met for her and baby: Yes.   Review of Systems:   Pertinent items are noted in HPI Denies Abnormal vaginal discharge w/ itching/odor/irritation, headaches, visual changes, shortness of breath, chest pain, abdominal pain, severe nausea/vomiting, or problems with urination or bowel movements. Pertinent History Reviewed:  Reviewed past medical,surgical, obstetrical and family history.  Reviewed problem list, medications and allergies. OB History  Gravida Para Term Preterm AB Living  1 1 1     1   SAB IAB Ectopic Multiple Live Births        0 1    # Outcome Date GA Lbr Len/2nd Weight Sex  Delivery Anes PTL Lv  1 Term 03/21/20 [id number] / 00:15 6 lb 13.9 oz (3.116 kg) M Vag-Spont None  LIV   Physical Assessment:   Vitals:   04/29/20 1222  BP: 104/64  Pulse: 70  Weight: 153 lb (69.4 kg)  Body mass index is 26.26 kg/m.       Physical Examination:   General appearance: alert, well appearing, and in no distress  Mental status: alert, oriented to person, place, and time  Skin: warm & dry   Cardiovascular: normal heart rate noted   Respiratory: normal respiratory effort, no distress   Breasts: deferred, no complaints   Abdomen: soft, non-tender   Pelvic: examination not indicated  Rectal:  no hemorrhoids  Extremities: tr edema       No results found for this or any previous visit (from the past 24 hour(s)).  Assessment & Plan:  1) Postpartum exam 2) Five and a half wks s/p spontaneous vaginal delivery 3) breast feeding 4) Depression screening- negative 5) Contraception management- to start POPs today and use backup method during the first month  Essential components of care per ACOG recommendations:  1.  Mood and well being:  . If positive depression screen, discussed and plan developed.  . If using tobacco we discussed reduction/cessation and risk of relapse . If current substance abuse, we discussed and referral to local resources was offered.   2. Infant care and feeding:  . If breastfeeding, discussed returning to work, pumping, breastfeeding-associated pain, guidance regarding return to fertility while lactating if not using another method. If needed, patient was provided with a letter to be allowed to pump q 2-3hrs to support lactation in a private location with access to a refrigerator to store breastmilk.   . Recommended that all caregivers be immunized for flu, pertussis and other preventable communicable diseases . If pt does not have material needs met for her/baby, referred to local resources for help obtaining these.  3. Sexuality, contraception and birth spacing . Provided guidance regarding sexuality, management of dyspareunia, and resumption of intercourse . Discussed avoiding interpregnancy interval <670ms and recommended birth spacing of 18 months  4. Sleep and fatigue . Discussed coping options for fatigue and sleep disruption . Encouraged family/partner/community support of 4 hrs of uninterrupted sleep to help with mood and fatigue  5. Physical recovery  . If pt had a C/S, assessed incisional pain and providing guidance on normal vs prolonged recovery . If pt had a laceration, perineal healing and pain reviewed.  . If urinary or fecal incontinence,  discussed management and referred to PT or uro/gyn if indicated  . Patient is safe to resume physical activity. Discussed attainment of healthy weight.  6.  Chronic disease management . Discussed pregnancy complications if any, and their implications for future childbearing and long-term maternal health. . Review recommendations for prevention of recurrent pregnancy complications, such as 17 hydroxyprogesterone caproate to reduce risk for recurrent PTB not applicable, or aspirin to reduce risk of preeclampsia not applicable. . Pt had GDM: No. If yes, 2hr GTT scheduled: not applicable. . Reviewed medications and non-pregnant dosing including consideration of whether pt is breastfeeding using a reliable resource such as LactMed: yes . Referred for f/u w/ PCP or subspecialist providers as indicated: not applicable  7. Health maintenance . Mammogram at 4040yor earlier if indicated . Pap smears as indicated  Meds:  Meds ordered this encounter  Medications  . norethindrone (MICRONOR) 0.35 MG tablet    Sig: Take 1  tablet (0.35 mg total) by mouth daily.    Dispense:  28 tablet    Refill:  11    Order Specific Question:   Supervising Provider    Answer:   Florian Buff [[id number]]    Follow-up: Return in about 3 months (around 07/30/2020) for contraception f/u.   No orders of the defined types were placed in this encounter.   Myrtis Ser CNM 04/29/2020 12:47 PM

## 2020-04-29 NOTE — Patient Instructions (Signed)
Oral Contraception Information Oral contraceptive pills (OCPs) are medicines taken by mouth to prevent pregnancy. They work by:  Preventing the ovaries from releasing eggs.  Thickening mucus in the lower part of the uterus (cervix). This prevents sperm from entering the uterus.  Thinning the lining of the uterus (endometrium). This prevents a fertilized egg from attaching to the endometrium. OCPs are highly effective when taken exactly as prescribed. However, OCPs do not prevent STIs (sexually transmitted infections). Using condoms while on an OCP can help prevent STIs. What happens before starting OCPs? Before you start taking OCPs:  You may have a physical exam, blood test, and Pap test.  Your health care provider will make sure you are a good candidate for oral contraception. OCPs are not a good option for certain women, such as: ? Women who smoke and are older than age 35. ? Women who have or have had certain conditions, such as:  A history of high blood pressure.  Deep vein thrombosis.  Pulmonary embolism.  Stroke.  Cardiovascular disease.  Peripheral vascular disease. Ask your health care provider about the possible side effects of the OCP you may be prescribed. Be aware that it can take 2-3 months for your body to adjust to changes in hormone levels. Types of oral contraception Birth control pills contain the hormones estrogen and progestin (synthetic progesterone) or progestin only. The combination pill This type of pill contains estrogen and progestin hormones.  Conventional contraception pills come in packs of 21 or 28 pills. ? Some packs with 28-day pills contain estrogen and progestin for the first 21-24 days. Hormone-free tablets, called placebos, are taken for the final 4-7 days. You should have menstrual bleeding during the time you take the placebos. ? In packs with 21 tablets, you take no pills for 7 days. Menstrual bleeding occurs during these days. (Some people  prefer taking a pill for 28 days to help establish a routine).  Extended-interval contraception pills come in packs of 91 pills. The first 84 tablets have both estrogen and progestin. The last 7 pills are placebos. Menstrual bleeding occurs during the placebo days. With this schedule, menstrual bleeding happens once every 3 months.  Continuous contraception pills come in packs of 28 pills. All pills in the pack contain estrogen and progestin. With this schedule, regular menstrual bleeding does not happen, but there may be spotting or irregular bleeding. Progestin-only pills This type of pill is often called the mini-pill and contains the progestin hormone only. It comes in packs of 28 pills. In some packs, the last 4 pills are placebos. The pill must be taken at the same time every day. This is very important to prevent pregnancy. Menstrual bleeding may not be regular or predictable.   What are the advantages? Oral contraception provides reliable and continuous contraception if taken as directed. It may treat or decrease symptoms of:  Menstrual period cramps.  Irregular menstrual cycle or bleeding.  Heavy menstrual flow.  Abnormal uterine bleeding.  Acne, depending on the type of pill.  Polycystic ovarian syndrome (POS).  Endometriosis.  Iron deficiency anemia.  Premenstrual symptoms, including severe irritability, depression, or anxiety. It also may:  Reduce the risk of endometrial and ovarian cancer.  Be used as emergency contraception.  Prevent ectopic pregnancies and infections of the fallopian tubes. What can make OCPs less effective? OCPs may be less effective if:  You forget to take the pill every day. For progestin-only pills, it is especially important to take the pill at the   same time each day. Even taking it 3 hours late can increase the risk of pregnancy.  You have a stomach or intestinal disease that reduces your body's ability to absorb the pill.  You take OCPs  with other medicines that make OCPs less effective, such as antibiotics, certain HIV medicines, and some seizure medicines.  You take expired OCPs.  You forget to restart the pill after 7 days of not taking it. This refers to the packs of 21 pills. What are the side effects and risks? OCPs can sometimes cause side effects, such as:  Headache.  Depression.  Trouble sleeping.  Nausea and vomiting.  Breast tenderness.  Irregular bleeding or spotting during the first several months.  Bloating or fluid retention.  Increase in blood pressure. Combination pills may slightly increase the risk of:  Blood clots.  Heart attack.  Stroke. Follow these instructions at home: Follow instructions from your health care provider about how to start taking your first cycle of OCPs. Depending on when you start the pill, you may need to use a backup form of birth control, such as condoms, during the first week. Make sure you know what steps to take if you forget to take the pill. Summary  Oral contraceptive pills (OCPs) are medicines taken by mouth to prevent pregnancy. They are highly effective when taken exactly as prescribed.  OCPs contain a combination of the hormones estrogen and progestin (synthetic progesterone) or progestin only.  Before you start taking the pill, you may have a physical exam, blood test, and Pap test. Your health care provider will make sure you are a good candidate for oral contraception.  The combination pill may come in a 21-day pack, a 28-day pack, or a 91-day pack. Progestin-only pills come in packs of 28 pills.  OCPs can sometimes cause side effects, such as headache, nausea, breast tenderness, or irregular bleeding. This information is not intended to replace advice given to you by your health care provider. Make sure you discuss any questions you have with your health care provider. Document Revised: 10/23/2019 Document Reviewed: 10/01/2019 Elsevier Patient  Education  2021 [employer].  

## 2020-07-27 ENCOUNTER — Ambulatory Visit: Payer: Medicaid Other | Admitting: Obstetrics & Gynecology

## 2020-08-09 IMAGING — NM NM GASTRIC EMPTYING
8 series · 8 of 8 positions shown · non-contrast
Comparison: 1oneR

CLINICAL DATA: Nausea, vomiting, abdominal pain, and indigestion
for 1 month, question gastroparesis

EXAM:
NUCLEAR MEDICINE GASTRIC EMPTYING SCAN
TECHNIQUE: After oral ingestion of radiolabeled meal, sequential abdominal
images were obtained for 4 hours. Percentage of activity emptying
the stomach was calculated at 1 hour, 2 hour, 3 hour, and 4 hours.
RADIOPHARMACEUTICALS:  2 mCi Bc-66m sulfur colloid in standardized
meal

[Series 1: 0 min · 4.14mm/px · 1 of 1 slices shown (1 of 2)]
[im 1/1]
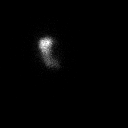

[Series 1: 0 min · 4.14mm/px · 1 of 1 slices shown (2 of 2)]
[im 1/1]
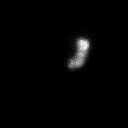

[Series 2: 60 min · 4.14mm/px · 1 of 1 slices shown (1 of 2)]
[im 1/1]
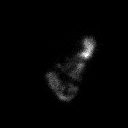

[Series 2: 60 min · 4.14mm/px · 1 of 1 slices shown (2 of 2)]
[im 1/1  full-range]
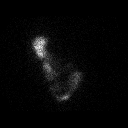

[Series 3: 120 min · 4.14mm/px · 1 of 1 slices shown (1 of 2)]
[im 1/1  full-range]
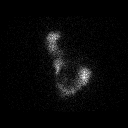

[Series 3: 120 min · 4.14mm/px · 1 of 1 slices shown (2 of 2)]
[im 1/1]
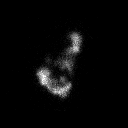

[Series 4: 180 min · 4.14mm/px · 1 of 1 slices shown (1 of 2)]
[im 1/1]
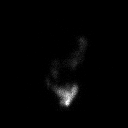

[Series 4: 180 min · 4.14mm/px · 1 of 1 slices shown (2 of 2)]
[im 1/1]
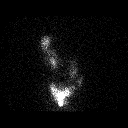

[8 of 8 positions shown; findings below may reference images not displayed]

FINDINGS: Expected location of the stomach in the left upper quadrant.

Ingested meal empties the stomach gradually over the course of the
study.

42% emptied at 1 hr ( normal >= 10%)

76% emptied at 2 hr ( normal >= 40%)

90% emptied at 3 hr ( normal >= 70%)

N/A% emptied at 4 hr ( normal >= 90%) **[HOSPITAL] 4 hours was not
required
IMPRESSION: Normal gastric emptying study.

## 2020-09-08 ENCOUNTER — Telehealth: Payer: Self-pay | Admitting: Women's Health

## 2020-09-08 NOTE — Telephone Encounter (Signed)
Please re-order BC - pt never picked up Rx from March  Norethindrone     Please advise & notify pt    [facility]

## 2020-09-08 NOTE — Telephone Encounter (Signed)
Called pt for clarification. The pt stated that she had finally made up her mind about whether to try the [facility] or not. She called the pharmacy and they said they could fill it. No new prescription needed.

## 2020-10-02 DIAGNOSIS — U071 COVID-19: Secondary | ICD-10-CM | POA: Diagnosis not present

## 2021-02-23 DIAGNOSIS — H1033 Unspecified acute conjunctivitis, bilateral: Secondary | ICD-10-CM | POA: Diagnosis not present

## 2021-02-23 DIAGNOSIS — Z973 Presence of spectacles and contact lenses: Secondary | ICD-10-CM | POA: Diagnosis not present

## 2021-02-23 DIAGNOSIS — J029 Acute pharyngitis, unspecified: Secondary | ICD-10-CM | POA: Diagnosis not present

## 2021-02-23 DIAGNOSIS — H5789 Other specified disorders of eye and adnexa: Secondary | ICD-10-CM | POA: Diagnosis not present

## 2022-02-05 NOTE — L&D Delivery Note (Signed)
Delivery Note ROM x 13h 55m. At 4:45 PM a viable and healthy female was delivered via Vaginal, Spontaneous (Presentation:   Occiput Anterior).  APGAR: 8, 9; weight 7 lb 4.1 oz (3290 g).  Shoulders delivered easily. Infant placed skin-to-skin w/ mom. Delayed cord clamping x 1 minute. Cord clamped x 2 and cut by FOB. Pitocin bolus started a few minutes after delivery of baby. 10 minutes after birth the placenta had not delivered with gentle cord traction. Vaginal exam revealed that cervix was completely closed around cord. Pitocxin bolus stopped. 20 minutes after delivery placenta was still not able to be delivered. VSS, scant visible bleeding. Placenta edge palpable at opening of cervix. Placenta manually removed. Appear disrupted but intact. Trailing membranes removed  carefully. Will given Methergine series in case of any retained POCs.  Placenta status: Manual removal, Intact.  Cord: 3 vessels with the following complications: Tightly wrapped around legs..  Cord pH: NA  Anesthesia: Epidural Episiotomy: None Lacerations: 1st degree, hemostatic. No repair indicated.  Suture Repair:  NA Est. Blood Loss (mL): 150  Mom to postpartum.  Baby to Couplet care / Skin to Skin. Placenta to: Home w/ pt Feeding: breast Circ: NA Contraception: Depo at [facility] visit.  Latasha Thompson 02/03/2023, 6:09 PM

## 2022-02-09 ENCOUNTER — Ambulatory Visit: Payer: Medicaid Other | Admitting: Family Medicine

## 2022-02-16 ENCOUNTER — Encounter: Payer: Self-pay | Admitting: Family Medicine

## 2022-02-16 ENCOUNTER — Ambulatory Visit: Payer: Medicaid Other | Admitting: Family Medicine

## 2022-02-16 VITALS — BP 107/67 | HR 85 | Ht 64.0 in | Wt 144.0 lb

## 2022-02-16 DIAGNOSIS — R7301 Impaired fasting glucose: Secondary | ICD-10-CM | POA: Diagnosis not present

## 2022-02-16 DIAGNOSIS — E785 Hyperlipidemia, unspecified: Secondary | ICD-10-CM | POA: Diagnosis not present

## 2022-02-16 DIAGNOSIS — Z114 Encounter for screening for human immunodeficiency virus [HIV]: Secondary | ICD-10-CM | POA: Diagnosis not present

## 2022-02-16 DIAGNOSIS — E039 Hypothyroidism, unspecified: Secondary | ICD-10-CM

## 2022-02-16 DIAGNOSIS — Z0001 Encounter for general adult medical examination with abnormal findings: Secondary | ICD-10-CM

## 2022-02-16 DIAGNOSIS — Z1159 Encounter for screening for other viral diseases: Secondary | ICD-10-CM | POA: Diagnosis not present

## 2022-02-16 NOTE — Progress Notes (Signed)
Complete physical exam  Patient: Latasha Thompson   DOB: 14-Aug-2000   21 y.o. Female  MRN: [id number]  Subjective:    Chief Complaint  Patient presents with   Establish Care    Latasha Thompson is a 22 y.o. female who presents today for a complete physical exam. She reports consuming  chicken and fish, does not eat pork beef diet. The patient does not participate in regular exercise at present. She generally feels well. She reports sleeping well. She does not have additional problems to discuss today.    Most recent fall risk assessment:    02/16/2022    2:07 PM  [place] in the past year? 0  Number falls in past yr: 0  Injury with Fall? 0  Risk for fall due to : No Fall Risks  Follow up Falls evaluation completed     Most recent depression screenings:    02/16/2022    2:07 PM 12/18/2019    9:03 AM  PHQ 2/9 Scores  PHQ - 2 Score 0 1  PHQ- 9 Score 1 1    Vision:5 year ago  Patient Care Team: Del Ria Comment, Lamar Benes, FNP as PCP - General (Family Medicine)   Outpatient Medications Prior to Visit  Medication Sig   albuterol (VENTOLIN HFA) 108 (90 Base) MCG/ACT inhaler Inhale into the lungs.   norethindrone (MICRONOR) 0.35 MG tablet Take 1 tablet (0.35 mg total) by mouth daily.   Prenatal Vit-Fe Fumarate-FA (PNV PRENATAL PLUS MULTIVITAMIN) 27-1 MG TABS Take 1 tablet by mouth daily.   No facility-administered medications prior to visit.    Review of Systems  Constitutional:  Negative for chills and fever.  HENT:  Negative for ear pain and hearing loss.   Eyes:  Negative for blurred vision and double vision.  Respiratory:  Negative for cough and shortness of breath.   Cardiovascular:  Negative for chest pain and orthopnea.  Gastrointestinal:  Negative for abdominal pain, nausea and vomiting.  Genitourinary:  Negative for dysuria.  Musculoskeletal:  Negative for myalgias.  Skin:  Negative for rash.  Neurological:  Negative for dizziness and headaches.   Psychiatric/Behavioral:  Negative for depression. The patient is not nervous/anxious.        Objective:    BP 107/67   Pulse 85   Ht 5\' 4"  (1.626 m)   Wt 144 lb (65.3 kg)   SpO2 97%   BMI 24.72 kg/m  BP Readings from Last 3 Encounters:  02/16/22 107/67  04/29/20 104/64  03/23/20 120/81      Physical Exam Vitals reviewed.  HENT:     Head: Normocephalic.     Nose: Nose normal.  Eyes:     Pupils: Pupils are equal, round, and reactive to light.  Cardiovascular:     Rate and Rhythm: Normal rate and regular rhythm.  Pulmonary:     Effort: Pulmonary effort is normal. No respiratory distress.     Breath sounds: Normal breath sounds.  Abdominal:     General: Abdomen is flat. Bowel sounds are normal.  Musculoskeletal:        General: Normal range of motion.     Cervical back: Normal range of motion.  Skin:    General: Skin is warm and dry.     Capillary Refill: Capillary refill takes less than 2 seconds.  Neurological:     Mental Status: She is alert. Mental status is at baseline.     Coordination: Coordination normal.  Gait: Gait normal.  Psychiatric:        Mood and Affect: Mood normal.      No results found for any visits on 02/16/22.    Assessment & Plan:    Routine Health Maintenance and Physical Exam  Immunization History  Administered Date(s) Administered   DTaP 12/05/2000, 04/21/2001, 08/11/2001, 04/20/2002, 10/26/2004   HIB (PRP-OMP) 12/05/2000, 04/21/2001, 08/11/2001, 04/20/2002   Hepatitis A 10/11/2005, 10/10/2007   Hepatitis B 2000/12/15, 12/05/2000, 08/11/2001   Hpv-Unspecified 06/27/2017   IPV 12/05/2000, 04/21/2001, 04/20/2002, 10/26/2004   Influenza-Unspecified 11/14/2011, 12/03/2012, 12/10/2013   MMR 10/20/2001, 10/26/2004   Meningococcal B, OMV 06/27/2017   Meningococcal Mcv4o 09/23/2012, 06/27/2017   Pneumococcal Conjugate-13 12/05/2000, 04/21/2001, 08/11/2001   Tdap 07/10/2011   Varicella 10/20/2001, 10/11/2005    Health  Maintenance  Topic Date Due   HPV VACCINES (2 - 3-dose series) 07/25/2017   CHLAMYDIA SCREENING  02/23/2021   DTaP/Tdap/Td (7 - Td or Tdap) 07/09/2021   PAP-Cervical Cytology Screening  Never done   PAP SMEAR-Modifier  Never done   INFLUENZA VACCINE  05/06/2022 (Originally 09/05/2021)   Hepatitis C Screening  Completed   HIV Screening  Completed    Discussed health benefits of physical activity, and encouraged her to engage in regular exercise appropriate for her age and condition.  There are no diagnoses linked to this encounter.  No follow-ups on file.     Renard Hamper Ria Comment, FNP

## 2022-02-16 NOTE — Patient Instructions (Addendum)
Please Follow up in 12 months for Annual Physical Exam.

## 2022-02-17 LAB — CMP14+EGFR
ALT: 11 IU/L (ref 0–32)
AST: 17 IU/L (ref 0–40)
Albumin/Globulin Ratio: 1.7 (ref 1.2–2.2)
Albumin: 4.3 g/dL (ref 4.0–5.0)
Alkaline Phosphatase: 66 IU/L (ref 44–121)
BUN/Creatinine Ratio: 14 (ref 9–23)
BUN: 11 mg/dL (ref 6–20)
Bilirubin Total: 0.4 mg/dL (ref 0.0–1.2)
CO2: 25 mmol/L (ref 20–29)
Calcium: 9.6 mg/dL (ref 8.7–10.2)
Chloride: 105 mmol/L (ref 96–106)
Creatinine, Ser: 0.81 mg/dL (ref 0.57–1.00)
Globulin, Total: 2.5 g/dL (ref 1.5–4.5)
Glucose: 59 mg/dL — ABNORMAL LOW (ref 70–99)
Potassium: 4.1 mmol/L (ref 3.5–5.2)
Sodium: 142 mmol/L (ref 134–144)
Total Protein: 6.8 g/dL (ref 6.0–8.5)
eGFR: 106 mL/min/{1.73_m2} (ref >59)

## 2022-02-17 LAB — CBC WITH DIFFERENTIAL/PLATELET
Basophils Absolute: 0 10*3/uL (ref 0.0–0.2)
Basos: 1 %
EOS (ABSOLUTE): 0.2 10*3/uL (ref 0.0–0.4)
Eos: 5 %
Hematocrit: 36.9 % (ref 34.0–46.6)
Hemoglobin: 12.1 g/dL (ref 11.1–15.9)
Immature Grans (Abs): 0 10*3/uL (ref 0.0–0.1)
Immature Granulocytes: 0 %
Lymphocytes Absolute: 2 10*3/uL (ref 0.7–3.1)
Lymphs: 44 %
MCH: 29.3 pg (ref 26.6–33.0)
MCHC: 32.8 g/dL (ref 31.5–35.7)
MCV: 89 fL (ref 79–97)
Monocytes Absolute: 0.4 10*3/uL (ref 0.1–0.9)
Monocytes: 9 %
Neutrophils Absolute: 1.8 10*3/uL (ref 1.4–7.0)
Neutrophils: 41 %
Platelets: 263 10*3/uL (ref 150–450)
RBC: 4.13 x10E6/uL (ref 3.77–5.28)
RDW: 12.4 % (ref 11.7–15.4)
WBC: 4.4 10*3/uL (ref 3.4–10.8)

## 2022-02-17 LAB — TSH+FREE T4
Free T4: 1.27 ng/dL (ref 0.82–1.77)
TSH: 0.783 u[IU]/mL (ref 0.450–4.500)

## 2022-02-17 LAB — HIV ANTIBODY (ROUTINE TESTING W REFLEX): HIV Screen 4th Generation wRfx: NONREACTIVE

## 2022-02-17 LAB — LIPID PANEL
Chol/HDL Ratio: 2 ratio (ref 0.0–4.4)
Cholesterol, Total: 127 mg/dL (ref 100–199)
HDL: 62 mg/dL (ref >39)
LDL Chol Calc (NIH): 57 mg/dL (ref 0–99)
Triglycerides: 27 mg/dL (ref 0–149)
VLDL Cholesterol Cal: 8 mg/dL (ref 5–40)

## 2022-02-17 LAB — HEMOGLOBIN A1C
Est. average glucose Bld gHb Est-mCnc: 105 mg/dL
Hgb A1c MFr Bld: 5.3 % (ref 4.8–5.6)

## 2022-02-17 LAB — HEPATITIS C ANTIBODY: Hep C Virus Ab: NONREACTIVE

## 2022-06-08 ENCOUNTER — Ambulatory Visit (INDEPENDENT_AMBULATORY_CARE_PROVIDER_SITE_OTHER): Payer: Medicaid Other | Admitting: *Deleted

## 2022-06-08 ENCOUNTER — Encounter: Payer: Self-pay | Admitting: *Deleted

## 2022-06-08 ENCOUNTER — Other Ambulatory Visit: Payer: Self-pay | Admitting: Adult Health

## 2022-06-08 VITALS — BP 107/69 | HR 101 | Ht 65.0 in | Wt 149.5 lb

## 2022-06-08 DIAGNOSIS — Z3201 Encounter for pregnancy test, result positive: Secondary | ICD-10-CM | POA: Diagnosis not present

## 2022-06-08 LAB — POCT URINE PREGNANCY: Preg Test, Ur: POSITIVE — AB

## 2022-06-08 MED ORDER — PRENATAL PLUS 27-1 MG PO TABS: 1.0000 | ORAL_TABLET | Freq: Every day | ORAL | 12 refills | Status: DC

## 2022-06-08 NOTE — Progress Notes (Signed)
Rx prenatal vitamin

## 2022-06-08 NOTE — Progress Notes (Signed)
   NURSE VISIT- PREGNANCY CONFIRMATION   SUBJECTIVE:  Latasha Thompson is a 22 y.o. G81P1001 female at [redacted]w[redacted]d by certain LMP of Patient's last menstrual period was 04/06/2022. Here for pregnancy confirmation.  Home pregnancy test: positive x 1.   She reports cramping.  She is not taking prenatal vitamins.    OBJECTIVE:  BP 107/69 (BP Location: Left Arm, Patient Position: Sitting, Cuff Size: Normal)   Pulse (!) 101   Ht 5\' 5"  (1.651 m)   Wt 149 lb 8 oz (67.8 kg)   LMP 04/06/2022   Breastfeeding Yes   BMI 24.88 kg/m   Appears well, in no apparent distress  Results for orders placed or performed in visit on 06/08/22 (from the past 24 hour(s))  POCT urine pregnancy   Collection Time: 06/08/22 10:12 AM  Result Value Ref Range   Preg Test, Ur Positive (A) Negative    ASSESSMENT: Positive pregnancy test, [redacted]w[redacted]d by LMP    PLAN: Schedule for dating ultrasound next available Prenatal vitamins: note routed to [facility] to send prescription   Nausea medicines: not currently needed   OB packet given: Yes  Malachy Mood  06/08/2022 10:13 AM

## 2022-06-12 ENCOUNTER — Other Ambulatory Visit: Payer: Self-pay | Admitting: Obstetrics & Gynecology

## 2022-06-12 ENCOUNTER — Telehealth: Payer: Self-pay

## 2022-06-12 DIAGNOSIS — O3680X0 Pregnancy with inconclusive fetal viability, not applicable or unspecified: Secondary | ICD-10-CM

## 2022-06-12 MED ORDER — ONDANSETRON HCL 4 MG PO TABS: 4.0000 mg | ORAL_TABLET | Freq: Three times a day (TID) | ORAL | 0 refills | Status: DC | PRN

## 2022-06-12 MED ORDER — PROMETHAZINE HCL 12.5 MG PO TABS: 12.5000 mg | ORAL_TABLET | Freq: Four times a day (QID) | ORAL | 1 refills | Status: DC | PRN

## 2022-06-12 NOTE — Telephone Encounter (Signed)
I called patient to see about coming in tomorrow for her dating ultrasound and she stated that she was needing some nausea medicine called in. She uses the [facility] in [place]. Please advise.

## 2022-06-12 NOTE — Addendum Note (Signed)
Addended by: Cyril Mourning A on: 06/12/2022 05:27 PM   Modules accepted: Orders

## 2022-06-12 NOTE — Telephone Encounter (Signed)
Rx phenergan

## 2022-06-12 NOTE — Telephone Encounter (Signed)
Will cancel phenergan and rx zofran 4 mg  she is breastfeeding

## 2022-06-13 ENCOUNTER — Ambulatory Visit (INDEPENDENT_AMBULATORY_CARE_PROVIDER_SITE_OTHER): Payer: Medicaid Other

## 2022-06-13 DIAGNOSIS — O3680X0 Pregnancy with inconclusive fetal viability, not applicable or unspecified: Secondary | ICD-10-CM

## 2022-06-13 DIAGNOSIS — Z3A09 9 weeks gestation of pregnancy: Secondary | ICD-10-CM | POA: Diagnosis not present

## 2022-06-13 DIAGNOSIS — Z3481 Encounter for supervision of other normal pregnancy, first trimester: Secondary | ICD-10-CM | POA: Diagnosis not present

## 2022-06-13 NOTE — Progress Notes (Signed)
[place] 5+6 wks,single IUP with yolk sac,FHR 102 bpm,normal ovaries,CRL 2.6 mm

## 2022-06-15 ENCOUNTER — Other Ambulatory Visit: Payer: Medicaid Other

## 2022-06-19 ENCOUNTER — Encounter: Payer: Self-pay | Admitting: *Deleted

## 2022-06-21 ENCOUNTER — Emergency Department (HOSPITAL_COMMUNITY)
Admission: EM | Admit: 2022-06-21 | Discharge: 2022-06-21 | Disposition: A | Discharge status: Home / Self Care | Payer: Medicaid Other | Attending: Emergency Medicine | Admitting: Emergency Medicine

## 2022-06-21 ENCOUNTER — Other Ambulatory Visit: Payer: Self-pay

## 2022-06-21 DIAGNOSIS — O210 Mild hyperemesis gravidarum: Secondary | ICD-10-CM | POA: Diagnosis not present

## 2022-06-21 DIAGNOSIS — O209 Hemorrhage in early pregnancy, unspecified: Secondary | ICD-10-CM | POA: Diagnosis not present

## 2022-06-21 DIAGNOSIS — O219 Vomiting of pregnancy, unspecified: Secondary | ICD-10-CM

## 2022-06-21 DIAGNOSIS — Z3A09 9 weeks gestation of pregnancy: Secondary | ICD-10-CM | POA: Diagnosis not present

## 2022-06-21 DIAGNOSIS — Z3A22 22 weeks gestation of pregnancy: Secondary | ICD-10-CM | POA: Diagnosis not present

## 2022-06-21 LAB — CBC WITH DIFFERENTIAL/PLATELET
Abs Immature Granulocytes: 0.02 10*3/uL (ref 0.00–0.07)
Basophils Absolute: 0 10*3/uL (ref 0.0–0.1)
Basophils Relative: 0 %
Eosinophils Absolute: 0 10*3/uL (ref 0.0–0.5)
Eosinophils Relative: 1 %
HCT: 33.6 % — ABNORMAL LOW (ref 36.0–46.0)
Hemoglobin: 11.2 g/dL — ABNORMAL LOW (ref 12.0–15.0)
Immature Granulocytes: 0 %
Lymphocytes Relative: 25 %
Lymphs Abs: 2 10*3/uL (ref 0.7–4.0)
MCH: 30.1 pg (ref 26.0–34.0)
MCHC: 33.3 g/dL (ref 30.0–36.0)
MCV: 90.3 fL (ref 80.0–100.0)
Monocytes Absolute: 0.6 10*3/uL (ref 0.1–1.0)
Monocytes Relative: 7 %
Neutro Abs: 5.5 10*3/uL (ref 1.7–7.7)
Neutrophils Relative %: 67 %
Platelets: 210 10*3/uL (ref 150–400)
RBC: 3.72 MIL/uL — ABNORMAL LOW (ref 3.87–5.11)
RDW: 12.7 % (ref 11.5–15.5)
WBC: 8.2 10*3/uL (ref 4.0–10.5)
nRBC: 0 % (ref 0.0–0.2)

## 2022-06-21 LAB — BASIC METABOLIC PANEL
Anion gap: 7 (ref 5–15)
BUN: 11 mg/dL (ref 6–20)
CO2: 25 mmol/L (ref 22–32)
Calcium: 8.9 mg/dL (ref 8.9–10.3)
Chloride: 100 mmol/L (ref 98–111)
Creatinine, Ser: 0.55 mg/dL (ref 0.44–1.00)
GFR, Estimated: >60 mL/min (ref >60)
Glucose, Bld: 110 mg/dL — ABNORMAL HIGH (ref 70–99)
Potassium: 3.5 mmol/L (ref 3.5–5.1)
Sodium: 132 mmol/L — ABNORMAL LOW (ref 135–145)

## 2022-06-21 LAB — URINALYSIS, ROUTINE W REFLEX MICROSCOPIC
Bilirubin Urine: NEGATIVE
Glucose, UA: NEGATIVE mg/dL
Hgb urine dipstick: NEGATIVE
Ketones, ur: NEGATIVE mg/dL
Nitrite: NEGATIVE
Protein, ur: 30 mg/dL — AB
Specific Gravity, Urine: 1.025 (ref 1.005–1.030)
pH: 7 (ref 5.0–8.0)

## 2022-06-21 MED ORDER — SODIUM CHLORIDE 0.9 % IV BOLUS
1000.0000 mL | Freq: Once | INTRAVENOUS | Status: AC
  Administered 2022-06-21: 1000 mL via INTRAVENOUS

## 2022-06-21 MED ORDER — SODIUM CHLORIDE 0.9 % IV SOLN: Freq: Once | INTRAVENOUS | Status: DC

## 2022-06-21 MED ORDER — ONDANSETRON 4 MG PO TBDP
4.0000 mg | ORAL_TABLET | Freq: Once | ORAL | Status: AC
  Administered 2022-06-21: 4 mg via ORAL
  Filled 2022-06-21: qty 1

## 2022-06-21 MED ORDER — ONDANSETRON 4 MG PO TBDP: 4.0000 mg | ORAL_TABLET | Freq: Three times a day (TID) | ORAL | 0 refills | Status: DC | PRN

## 2022-06-21 NOTE — Discharge Instructions (Addendum)
Zofran every 6 hours as needed for nausea as prescribed by your gynecologist, ER for worsening symptoms, drink plenty of clear liquids

## 2022-06-21 NOTE — ED Provider Notes (Signed)
[place] EMERGENCY DEPARTMENT AT [facility] Provider Note   CSN: [id number] Arrival date & time: 06/21/22  1707     History  Chief Complaint  Patient presents with   Emesis During Pregnancy    Latasha Thompson is a G2P1 22 y.o. female who presents to the ED today for nausea and vomiting for the past 4 days. Patient reports that she has not been able to hold food down since the onset of symptoms, so she was advised to come to the ED today per her OB/GYN. She recently had an ultrasound confirming intrauterine pregnancy with an estimated gestational age of [redacted] weeks. She denies any abdominal pain, vaginal bleeding, or dysuria.  No fever, recent illness, or sick contact. No additional complaints or concerns at this time.    Home Medications Prior to Admission medications   Medication Sig Start Date End Date Taking? Authorizing Provider  ondansetron (ZOFRAN-ODT) 4 MG disintegrating tablet Take 1 tablet (4 mg total) by mouth every 8 (eight) hours as needed for nausea. 06/21/22  Yes Eber Hong, MD  ondansetron (ZOFRAN) 4 MG tablet Take 1 tablet (4 mg total) by mouth every 8 (eight) hours as needed for nausea or vomiting. 06/12/22   Adline Potter, NP  prenatal vitamin w/FE, FA (PRENATAL 1 + 1) 27-1 MG TABS tablet Take 1 tablet by mouth daily at 12 noon. 06/08/22   Adline Potter, NP      Allergies    Patient has no known allergies.    Review of Systems   Review of Systems  Gastrointestinal:  Positive for nausea and vomiting.  All other systems reviewed and are negative.   Physical Exam Updated Vital Signs BP 98/65 (BP Location: Left Arm)   Pulse 69   Temp 98.2 F (36.8 C) (Oral)   Resp 16   LMP 04/06/2022 (Approximate)   SpO2 100%  Physical Exam Vitals and nursing note reviewed.  Constitutional:      Appearance: Normal appearance.  HENT:     Head: Normocephalic and atraumatic.     Mouth/Throat:     Mouth: Mucous membranes are moist.  Eyes:      Conjunctiva/sclera: Conjunctivae normal.     Pupils: Pupils are equal, round, and reactive to light.  Cardiovascular:     Rate and Rhythm: Normal rate and regular rhythm.     Pulses: Normal pulses.  Pulmonary:     Effort: Pulmonary effort is normal.     Breath sounds: Normal breath sounds.  Abdominal:     Palpations: Abdomen is soft.     Tenderness: There is no abdominal tenderness.  Skin:    General: Skin is warm and dry.     Findings: No rash.  Neurological:     General: No focal deficit present.     Mental Status: She is alert.  Psychiatric:        Mood and Affect: Mood normal.        Behavior: Behavior normal.     ED Results / Procedures / Treatments   Labs (all labs ordered are listed, but only abnormal results are displayed) Labs Reviewed  CBC WITH DIFFERENTIAL/PLATELET - Abnormal; Notable for the following components:      Result Value   RBC 3.72 (*)    Hemoglobin 11.2 (*)    HCT 33.6 (*)    All other components within normal limits  BASIC METABOLIC PANEL - Abnormal; Notable for the following components:   Sodium 132 (*)  Glucose, Bld 110 (*)    All other components within normal limits  URINALYSIS, ROUTINE W REFLEX MICROSCOPIC - Abnormal; Notable for the following components:   APPearance HAZY (*)    Protein, ur 30 (*)    Leukocytes,Ua SMALL (*)    Bacteria, UA RARE (*)    All other components within normal limits    EKG None  Radiology No results found.  Procedures Procedures: Not indicated.   Medications Ordered in ED Medications  ondansetron (ZOFRAN-ODT) disintegrating tablet 4 mg (4 mg Oral Given 06/21/22 1814)  sodium chloride 0.9 % bolus 1,000 mL (0 mLs Intravenous Stopped 06/21/22 1931)    ED Course/ Medical Decision Making/ A&P                             Medical Decision Making Amount and/or Complexity of Data Reviewed Labs: ordered.  Risk Prescription drug management.   This patient presents to the ED for concern of nausea and  vomiting 9 while [redacted] weeks pregnant, this involves an extensive number of treatment options, and is a complaint that carries with it a high risk of complications and morbidity.  The differential diagnosis includes N/V associated with pregnancy, electrolyte abnormality, hyperemesis gravidarum.   Co morbidities that complicate the patient evaluation  [redacted] weeks pregnant   Additional history obtained:  Additional history obtained from patient. External records from outside source obtained and reviewed including OB/GYN notes.   Lab Tests:  I Ordered, and personally interpreted labs.  The pertinent results include:     Problem List / ED Course / Critical interventions / Medication management  First trimester nausea and vomiting I ordered medication including Zofran ODT for nausea  Reevaluation of the patient after these medicines showed that the patient improved I have reviewed the patients home medicines and have made adjustments as needed   Social Determinants of Health:  Pregnancy   Test / Admission - Considered:  Patient presented to ED per request of her OB/GYN for first trimester nausea and vomiting. Electrolyte levels were evaluated and within normal limits. Patient reports her doctor put her on Zofran tablets but she was not able to keep them down. She was given fluids and Zofran ODT with symptom relief in the ED tonight. I will give her a prescription for the dissolvable tablet version of the medication upon discharge home.         Final Clinical Impression(s) / ED Diagnoses Final diagnoses:  Nausea and vomiting during pregnancy prior to [redacted] weeks gestation    Rx / DC Orders ED Discharge Orders          Ordered    ondansetron (ZOFRAN-ODT) 4 MG disintegrating tablet  Every 8 hours PRN        06/21/22 2106              Maxwell Marion, PA-C 06/21/22 2122    Eber Hong, MD 06/23/22 [phone]

## 2022-06-21 NOTE — ED Triage Notes (Signed)
Pt presents for eval after having n/v d/t pregnancy, approximately 6-7 weeks. .  Pt had called her OB and was told [facility] as she may be dehydrated.  No pain or other complaints.  Just feels tired and possibly dehydrated.

## 2022-06-21 NOTE — ED Notes (Signed)
Pt tolerated drinking apple juice with no emesis.

## 2022-07-04 ENCOUNTER — Telehealth: Payer: Self-pay | Admitting: Adult Health

## 2022-07-04 ENCOUNTER — Other Ambulatory Visit: Payer: Self-pay | Admitting: Adult Health

## 2022-07-04 NOTE — Telephone Encounter (Signed)
Left message to call back. If she calls, did to know if breast feeding

## 2022-07-05 ENCOUNTER — Telehealth: Payer: Self-pay | Admitting: Adult Health

## 2022-07-05 MED ORDER — ONDANSETRON 4 MG PO TBDP: 4.0000 mg | ORAL_TABLET | Freq: Three times a day (TID) | ORAL | 1 refills | Status: DC | PRN

## 2022-07-05 NOTE — Telephone Encounter (Signed)
Pt is requesting a call about her medications

## 2022-07-05 NOTE — Telephone Encounter (Signed)
Will refill zofran

## 2022-07-05 NOTE — Addendum Note (Signed)
Addended by: Cyril Mourning A on: 07/05/2022 05:10 PM   Modules accepted: Orders

## 2022-07-05 NOTE — Telephone Encounter (Signed)
Patient called returning Jennifer's call from yesterday.  States she is still breastfeeding.  Will send message to provider and advised to check back with her pharmacy later this evening.

## 2022-07-27 ENCOUNTER — Encounter: Payer: Self-pay | Admitting: Advanced Practice Midwife

## 2022-07-27 ENCOUNTER — Other Ambulatory Visit: Payer: Self-pay | Admitting: Obstetrics & Gynecology

## 2022-07-27 DIAGNOSIS — Z3682 Encounter for antenatal screening for nuchal translucency: Secondary | ICD-10-CM

## 2022-07-27 DIAGNOSIS — Z3490 Encounter for supervision of normal pregnancy, unspecified, unspecified trimester: Secondary | ICD-10-CM | POA: Insufficient documentation

## 2022-07-30 ENCOUNTER — Ambulatory Visit (INDEPENDENT_AMBULATORY_CARE_PROVIDER_SITE_OTHER): Payer: Medicaid Other | Admitting: Advanced Practice Midwife

## 2022-07-30 ENCOUNTER — Ambulatory Visit (INDEPENDENT_AMBULATORY_CARE_PROVIDER_SITE_OTHER): Payer: Medicaid Other

## 2022-07-30 ENCOUNTER — Encounter: Payer: Self-pay | Admitting: Advanced Practice Midwife

## 2022-07-30 ENCOUNTER — Encounter: Payer: Medicaid Other | Admitting: *Deleted

## 2022-07-30 VITALS — BP 110/64 | HR 91

## 2022-07-30 DIAGNOSIS — Z3A12 12 weeks gestation of pregnancy: Secondary | ICD-10-CM | POA: Diagnosis not present

## 2022-07-30 DIAGNOSIS — Z3480 Encounter for supervision of other normal pregnancy, unspecified trimester: Secondary | ICD-10-CM

## 2022-07-30 DIAGNOSIS — Z3682 Encounter for antenatal screening for nuchal translucency: Secondary | ICD-10-CM | POA: Diagnosis not present

## 2022-07-30 DIAGNOSIS — Z3481 Encounter for supervision of other normal pregnancy, first trimester: Secondary | ICD-10-CM

## 2022-07-30 DIAGNOSIS — Z363 Encounter for antenatal screening for malformations: Secondary | ICD-10-CM

## 2022-07-30 MED ORDER — BLOOD PRESSURE MONITOR MISC
0 refills | Status: DC
Start: 2022-07-30 — End: 2023-02-04

## 2022-07-30 MED ORDER — DOXYLAMINE-PYRIDOXINE 10-10 MG PO TBEC: DELAYED_RELEASE_TABLET | ORAL | 6 refills | Status: DC

## 2022-07-30 NOTE — Addendum Note (Signed)
Addended by: Moss Mc on: 07/30/2022 04:17 PM   Modules accepted: Orders

## 2022-07-30 NOTE — Addendum Note (Signed)
Addended by: Moss Mc on: 07/30/2022 05:19 PM   Modules accepted: Orders

## 2022-07-30 NOTE — Progress Notes (Signed)
[place] 12+4 wks,measurements c/w dates,FHR 155 bpm,NB present,NT 1.7 mm,CRL 68.20 mm,anterior placenta

## 2022-07-30 NOTE — Progress Notes (Signed)
INITIAL OBSTETRICAL VISIT Patient name: Latasha Thompson MRN [id number]  Date of birth: [birth year] Chief Complaint:   Initial Prenatal Visit (dizziness)  History of Present Illness:   Latasha Thompson is a 22 y.o. G11P1001  female at [redacted]w[redacted]d by [place] at 5 weeks with an Estimated Date of Delivery: 02/07/23 being seen today for her initial obstetrical visit.   Her obstetrical history is significant for term SVD w/o problems.   Today she reports  still some nausea, taking zofran.  Will rx diclegis    07/30/2022    3:22 PM 02/16/2022    2:07 PM 12/18/2019    9:03 AM 09/08/2019   11:06 AM 07/14/2019   10:44 AM  Depression screen PHQ 2/9  Decreased Interest 0 0 1 2 0  Down, Depressed, Hopeless 0 0 0 0 0  PHQ - 2 Score 0 0 1 2 0  Altered sleeping 2 1 0 0 1  Tired, decreased energy 2 0 0 1 1  Change in appetite 2 0 0 0 2  Feeling bad or failure about yourself  0 0 0 0 0  Trouble concentrating 0 0 0 0 0  Moving slowly or fidgety/restless 0 0 0 0 0  Suicidal thoughts 0 0 0 0 0  PHQ-9 Score 6 1 1 3 4   Difficult doing work/chores  Not difficult at all   Not difficult at all    Patient's last menstrual period was 04/06/2022 (approximate). Last pap n/a Review of Systems:   Pertinent items are noted in HPI Denies cramping/contractions, leakage of fluid, vaginal bleeding, abnormal vaginal discharge w/ itching/odor/irritation, headaches, visual changes, shortness of breath, chest pain, abdominal pain, severe nausea/vomiting, or problems with urination or bowel movements unless otherwise stated above.  Pertinent History Reviewed:  Reviewed past medical,surgical, social, obstetrical and family history.  Reviewed problem list, medications and allergies. OB History  Gravida Para Term Preterm AB Living  2 1 1     1   SAB IAB Ectopic Multiple Live Births        0 1    # Outcome Date GA Lbr Len/2nd Weight Sex Delivery Anes PTL Lv  2 Current           1 Term 03/21/20 [redacted]w[redacted]d 17:38 / 00:15 6 lb 13.9 oz (3.116 kg)  M Vag-Spont None N LIV   Physical Assessment:   Vitals:   07/30/22 1519  BP: 110/64  Pulse: 91  There is no height or weight on file to calculate BMI.       Physical Examination:  General appearance - well appearing, and in no distress  Mental status - alert, oriented to person, place, and time  Psych:  She has a normal mood and affect  Skin - warm and dry, normal color, no suspicious lesions noted  Chest - effort normal  Heart - normal rate and regular rhythm  Abdomen - soft, nontender  Extremities:  No swelling or varicosities noted  BABY SLEEPING, declines pap today  TODAY'S NT [place] 12+4 wks,measurements c/w dates,FHR 155 bpm,NB present,NT 1.7 mm,CRL 68.20 mm,anterior placenta   No results found for this or any previous visit (from the past 24 hour(s)).   Indications for ASA therapy (per uptodate) NONE     07/30/2022    3:22 PM 02/16/2022    2:07 PM 12/18/2019    9:03 AM 09/08/2019   11:06 AM 07/14/2019   10:44 AM  Depression screen PHQ 2/9  Decreased Interest 0 0 1 2 0  Down, Depressed, Hopeless 0 0 0 0 0  PHQ - 2 Score 0 0 1 2 0  Altered sleeping 2 1 0 0 1  Tired, decreased energy 2 0 0 1 1  Change in appetite 2 0 0 0 2  Feeling bad or failure about yourself  0 0 0 0 0  Trouble concentrating 0 0 0 0 0  Moving slowly or fidgety/restless 0 0 0 0 0  Suicidal thoughts 0 0 0 0 0  PHQ-9 Score 6 1 1 3 4   Difficult doing work/chores  Not difficult at all   Not difficult at all        07/30/2022    3:23 PM 02/16/2022    2:07 PM 12/18/2019    9:03 AM 09/08/2019   11:06 AM  GAD 7 : Generalized Anxiety Score  Nervous, Anxious, on Edge 0 0 0 1  Control/stop worrying 1 1 0 0  Worry too much - different things 1 1 0 0  Trouble relaxing 1 0 0 0  Restless 0 0 0 0  Easily annoyed or irritable 2 1 0 0  Afraid - awful might happen 0 0 0 0  Total GAD 7 Score 5 3 0 1  Anxiety Difficulty  Not difficult at all        Assessment & Plan:  1) Low-Risk Pregnancy G2P1001 at [redacted]w[redacted]d  with an Estimated Date of Delivery: 02/07/23   2) Initial OB visit    1. Supervision of other normal pregnancy, antepartum   2. Antenatal screening for malformation using ultrasonics  - US OB Comp + 14 Wk; Future  3. [redacted] weeks gestation of pregnancy        Meds:  Meds ordered this encounter  Medications   Blood Pressure Monitor MISC    Sig: For regular home bp monitoring during pregnancy    Dispense:  1 each    Refill:  0    Z34.81 Please mail to patient   Doxylamine-Pyridoxine (DICLEGIS) 10-10 MG TBEC    Sig: Take 2 qhs; may also take one in am and one in afternoon prn nausea    Dispense:  120 tablet    Refill:  6    Order Specific Question:   Supervising Provider    Answer:   Duane Lope H [[id number]]    Initial labs obtained Continue prenatal vitamins Reviewed n/v relief measures and warning s/s to report Reviewed recommended weight gain based on pre-gravid BMI Encouraged well-balanced diet Genetic & carrier screening discussed: requests Panorama, NT/IT, and Horizon , declines AFP Ultrasound discussed; fetal survey: requested CCNC completed> form faxed if has or is planning to apply for medicaid The nature of [facility] [employer] with multiple MDs and other Advanced Practice Providers was explained to patient; also emphasized that fellows, residents, and students are part of our team. Doesn't have a home bp cuff. Rx faxed. Check bp weekly, let us know if >140/90.        Latasha Thompson 3:57 PM

## 2022-07-30 NOTE — Patient Instructions (Signed)
Latasha Thompson, I greatly value your feedback.  If you receive a survey following your visit with [place] today, we appreciate you taking the time to fill it out.  Thanks, Cathie Beams, DNP, CNM  [facility] HAS MOVED!!! It is now [facility] at [facility] ([address] [place], [place] [address]) Entrance located off of [address] Free 24/7 valet parking   Nausea & Vomiting Have saltine crackers or pretzels by your bed and eat a few bites before you raise your head out of bed in the morning Eat small frequent meals throughout the day instead of large meals Drink plenty of fluids throughout the day to stay hydrated, just don't drink a lot of fluids with your meals.  This can make your stomach fill up faster making you feel sick Do not brush your teeth right after you eat Products with real ginger are good for nausea, like ginger ale and ginger hard candy Make sure it says made with real ginger! Sucking on sour candy like lemon heads is also good for nausea If your prenatal vitamins make you nauseated, take them at night so you will sleep through the nausea Sea Bands If you feel like you need medicine for the nausea & vomiting please let us know If you are unable to keep any fluids or food down please let us know   Constipation Drink plenty of fluid, preferably water, throughout the day Eat foods high in fiber such as fruits, vegetables, and grains Exercise, such as walking, is a good way to keep your bowels regular Drink warm fluids, especially warm prune juice, or decaf coffee Eat a 1/2 cup of real oatmeal (not instant), 1/2 cup applesauce, and 1/2-1 cup warm prune juice every day If needed, you may take Colace (docusate sodium) stool softener once or twice a day to help keep the stool soft.  If you still are having problems with constipation, you may take Miralax once daily as needed to help keep your bowels regular.   Home Blood Pressure Monitoring for  Patients   Your provider has recommended that you check your blood pressure (BP) at least once a week at home. If you do not have a blood pressure cuff at home, one will be provided for you. Contact your provider if you have not received your monitor within 1 week.   Helpful Tips for Accurate Home Blood Pressure Checks  Don't smoke, exercise, or drink caffeine 30 minutes before checking your BP Use the restroom before checking your BP (a full bladder can raise your pressure) Relax in a comfortable upright chair Feet on the ground Left arm resting comfortably on a flat surface at the level of your heart Legs uncrossed Back supported Sit quietly and don't talk Place the cuff on your bare arm Adjust snuggly, so that only two fingertips can fit between your skin and the top of the cuff Check 2 readings separated by at least one minute Keep a log of your BP readings For a visual, please reference this diagram: http://ccnc.care/bpdiagram  Provider Name: [facility]     Phone: [phone]  Zone 1: ALL CLEAR  Continue to monitor your symptoms:  BP reading is less than 140 (top number) or less than 90 (bottom number)  No right upper stomach pain No headaches or seeing spots No feeling nauseated or throwing up No swelling in face and hands  Zone 2: CAUTION Call your doctor's office for any of the following:  BP reading is greater than 140 (top number) or greater than 90 (bottom number)  Stomach pain under your ribs in the middle or right side Headaches or seeing spots Feeling nauseated or throwing up Swelling in face and hands  Zone 3: EMERGENCY  Seek immediate medical care if you have any of the following:  BP reading is greater than160 (top number) or greater than 110 (bottom number) Severe headaches not improving with Tylenol Serious difficulty catching your breath Any worsening symptoms from Zone 2    First Trimester of Pregnancy The first trimester of pregnancy is from  week 1 until the end of week 12 (months 1 through 3). A week after a sperm fertilizes an egg, the egg will implant on the wall of the uterus. This embryo will begin to develop into a baby. Genes from you and your partner are forming the baby. The female genes determine whether the baby is a boy or a girl. At 6-8 weeks, the eyes and face are formed, and the heartbeat can be seen on ultrasound. At the end of 12 weeks, all the baby's organs are formed.  Now that you are pregnant, you will want to do everything you can to have a healthy baby. Two of the most important things are to get good prenatal care and to follow your health care provider's instructions. Prenatal care is all the medical care you receive before the baby's birth. This care will help prevent, find, and treat any problems during the pregnancy and childbirth. BODY CHANGES Your body goes through many changes during pregnancy. The changes vary from woman to woman.  You may gain or lose a couple of pounds at first. You may feel sick to your stomach (nauseous) and throw up (vomit). If the vomiting is uncontrollable, call your health care provider. You may tire easily. You may develop headaches that can be relieved by medicines approved by your health care provider. You may urinate more often. Painful urination may mean you have a bladder infection. You may develop heartburn as a result of your pregnancy. You may develop constipation because certain hormones are causing the muscles that push waste through your intestines to slow down. You may develop hemorrhoids or swollen, bulging veins (varicose veins). Your breasts may begin to grow larger and become tender. Your nipples may stick out more, and the tissue that surrounds them (areola) may become darker. Your gums may bleed and may be sensitive to brushing and flossing. Dark spots or blotches (chloasma, mask of pregnancy) may develop on your face. This will likely fade after the baby is  born. Your menstrual periods will stop. You may have a loss of appetite. You may develop cravings for certain kinds of food. You may have changes in your emotions from day to day, such as being excited to be pregnant or being concerned that something may go wrong with the pregnancy and baby. You may have more vivid and strange dreams. You may have changes in your hair. These can include thickening of your hair, rapid growth, and changes in texture. Some women also have hair loss during or after pregnancy, or hair that feels dry or thin. Your hair will most likely return to normal after your baby is born. WHAT TO EXPECT AT YOUR PRENATAL VISITS During a routine prenatal visit: You will be weighed to make sure you and the baby are growing normally. Your blood pressure will be taken. Your abdomen will be measured to track your baby's growth. The fetal  heartbeat will be listened to starting around week 10 or 12 of your pregnancy. Test results from any previous visits will be discussed. Your health care provider may ask you: How you are feeling. If you are feeling the baby move. If you have had any abnormal symptoms, such as leaking fluid, bleeding, severe headaches, or abdominal cramping. If you have any questions. Other tests that may be performed during your first trimester include: Blood tests to find your blood type and to check for the presence of any previous infections. They will also be used to check for low iron levels (anemia) and Rh antibodies. Later in the pregnancy, blood tests for diabetes will be done along with other tests if problems develop. Urine tests to check for infections, diabetes, or protein in the urine. An ultrasound to confirm the proper growth and development of the baby. An amniocentesis to check for possible genetic problems. Fetal screens for spina bifida and Down syndrome. You may need other tests to make sure you and the baby are doing well. HOME CARE  INSTRUCTIONS  Medicines Follow your health care provider's instructions regarding medicine use. Specific medicines may be either safe or unsafe to take during pregnancy. Take your prenatal vitamins as directed. If you develop constipation, try taking a stool softener if your health care provider approves. Diet Eat regular, well-balanced meals. Choose a variety of foods, such as meat or vegetable-based protein, fish, milk and low-fat dairy products, vegetables, fruits, and whole grain breads and cereals. Your health care provider will help you determine the amount of weight gain that is right for you. Avoid raw meat and uncooked cheese. These carry germs that can cause birth defects in the baby. Eating four or five small meals rather than three large meals a day may help relieve nausea and vomiting. If you start to feel nauseous, eating a few soda crackers can be helpful. Drinking liquids between meals instead of during meals also seems to help nausea and vomiting. If you develop constipation, eat more high-fiber foods, such as fresh vegetables or fruit and whole grains. Drink enough fluids to keep your urine clear or pale yellow. Activity and Exercise Exercise only as directed by your health care provider. Exercising will help you: Control your weight. Stay in shape. Be prepared for labor and delivery. Experiencing pain or cramping in the lower abdomen or low back is a good sign that you should stop exercising. Check with your health care provider before continuing normal exercises. Try to avoid standing for long periods of time. Move your legs often if you must stand in one place for a long time. Avoid heavy lifting. Wear low-heeled shoes, and practice good posture. You may continue to have sex unless your health care provider directs you otherwise. Relief of Pain or Discomfort Wear a good support bra for breast tenderness.   Take warm sitz baths to soothe any pain or discomfort caused by  hemorrhoids. Use hemorrhoid cream if your health care provider approves.   Rest with your legs elevated if you have leg cramps or low back pain. If you develop varicose veins in your legs, wear support hose. Elevate your feet for 15 minutes, 3-4 times a day. Limit salt in your diet. Prenatal Care Schedule your prenatal visits by the twelfth week of pregnancy. They are usually scheduled monthly at first, then more often in the last 2 months before delivery. Write down your questions. Take them to your prenatal visits. Keep all your prenatal visits as directed  by your health care provider. Safety Wear your seat belt at all times when driving. Make a list of emergency phone numbers, including numbers for family, friends, the hospital, and police and fire departments. General Tips Ask your health care provider for a referral to a local prenatal education class. Begin classes no later than at the beginning of month 6 of your pregnancy. Ask for help if you have counseling or nutritional needs during pregnancy. Your health care provider can offer advice or refer you to specialists for help with various needs. Do not use hot tubs, steam rooms, or saunas. Do not douche or use tampons or scented sanitary pads. Do not cross your legs for long periods of time. Avoid cat litter boxes and soil used by cats. These carry germs that can cause birth defects in the baby and possibly loss of the fetus by miscarriage or stillbirth. Avoid all smoking, herbs, alcohol, and medicines not prescribed by your health care provider. Chemicals in these affect the formation and growth of the baby. Schedule a dentist appointment. At home, brush your teeth with a soft toothbrush and be gentle when you floss. SEEK MEDICAL CARE IF:  You have dizziness. You have mild pelvic cramps, pelvic pressure, or nagging pain in the abdominal area. You have persistent nausea, vomiting, or diarrhea. You have a bad smelling vaginal  discharge. You have pain with urination. You notice increased swelling in your face, hands, legs, or ankles. SEEK IMMEDIATE MEDICAL CARE IF:  You have a fever. You are leaking fluid from your vagina. You have spotting or bleeding from your vagina. You have severe abdominal cramping or pain. You have rapid weight gain or loss. You vomit blood or material that looks like coffee grounds. You are exposed to [place] measles and have never had them. You are exposed to fifth disease or chickenpox. You develop a severe headache. You have shortness of breath. You have any kind of trauma, such as from a fall or a car accident. Document Released: 01/16/2001 Document Revised: 06/08/2013 Document Reviewed: 12/02/2012 [facility] Patient Information 2015 [place], [place]. This information is not intended to replace advice given to you by your health care provider. Make sure you discuss any questions you have with your health care provider.  ADDITIONAL HEALTHCARE OPTIONS FOR PATIENTS  [facility] / e-Visit: [email or web address]         [facility]: [phone]  [facility]: [phone]                   [facility]: [phone]     Safe Medications in Pregnancy   Acne: Benzoyl Peroxide Salicylic Acid  Backache/Headache: Tylenol: 2 regular strength every 4 hours OR              2 Extra strength every 6 hours  Colds/Coughs/Allergies: Benadryl (alcohol free) 25 mg every 6 hours as needed Breath right strips Claritin Cepacol throat lozenges Chloraseptic throat spray Cold-Eeze- up to three times per day Cough drops, alcohol free Flonase (by prescription only) Guaifenesin Mucinex Robitussin DM (plain only, alcohol free) Saline nasal spray/drops Sudafed (pseudoephedrine) & Actifed ** use only after [redacted] weeks gestation and if you do not have high blood pressure Tylenol Vicks Vaporub Zinc  lozenges Zyrtec   Constipation: Colace Ducolax suppositories Fleet enema Glycerin suppositories Metamucil Milk of magnesia Miralax Senokot Smooth move tea  Diarrhea: Kaopectate Imodium A-D  *NO pepto Bismol  Hemorrhoids: Anusol Anusol HC Preparation H Tucks  Indigestion: Tums Maalox  Mylanta Zantac  Pepcid  Insomnia: Benadryl (alcohol free) 25mg  every 6 hours as needed Tylenol PM Unisom, no Gelcaps  Leg Cramps: Tums MagGel  Nausea/Vomiting:  Bonine Dramamine Emetrol Ginger extract Sea bands Meclizine  Nausea medication to take during pregnancy:  Unisom (doxylamine succinate 25 mg tablets) Take one tablet daily at bedtime. If symptoms are not adequately controlled, the dose can be increased to a maximum recommended dose of two tablets daily (1/2 tablet in the morning, 1/2 tablet mid-afternoon and one at bedtime). Vitamin B6 100mg  tablets. Take one tablet twice a day (up to 200 mg per day).  Skin Rashes: Aveeno products Benadryl cream or 25mg  every 6 hours as needed Calamine Lotion 1% cortisone cream  Yeast infection: Gyne-lotrimin 7 Monistat 7   **If taking multiple medications, please check labels to avoid duplicating the same active ingredients **take medication as directed on the label ** Do not exceed 4000 mg of tylenol in 24 hours **Do not take medications that contain aspirin or ibuprofen

## 2022-07-31 LAB — CBC/D/PLT+RPR+RH+ABO+RUBIGG...
Antibody Screen: NEGATIVE
Basophils Absolute: 0 10*3/uL (ref 0.0–0.2)
Hemoglobin: 12.3 g/dL (ref 11.1–15.9)
Hepatitis B Surface Ag: NEGATIVE
Immature Grans (Abs): 0 10*3/uL (ref 0.0–0.1)
MCHC: 32.9 g/dL (ref 31.5–35.7)
MCV: 91 fL (ref 79–97)
Monocytes: 6 %
Neutrophils Absolute: 5.7 10*3/uL (ref 1.4–7.0)
Platelets: 275 10*3/uL (ref 150–450)
RDW: 13.3 % (ref 11.7–15.4)

## 2022-07-31 LAB — INTEGRATED 1

## 2022-07-31 LAB — GC/CHLAMYDIA PROBE AMP
Chlamydia trachomatis, NAA: NEGATIVE
Neisseria Gonorrhoeae by PCR: NEGATIVE

## 2022-08-01 LAB — INTEGRATED 1
Gest. Age on Collection Date: 12.9 weeks
Maternal Age at EDD: 22.3 yr
Nuchal Translucency (NT): 1.7 mm
PAPP-A Value: 1820.8 ng/mL
Weight: 150 [lb_av]

## 2022-08-01 LAB — CBC/D/PLT+RPR+RH+ABO+RUBIGG...
Basos: 0 %
EOS (ABSOLUTE): 0.1 10*3/uL (ref 0.0–0.4)
Eos: 1 %
HCV Ab: NONREACTIVE
HIV Screen 4th Generation wRfx: NONREACTIVE
Hematocrit: 37.4 % (ref 34.0–46.6)
Immature Granulocytes: 0 %
Lymphocytes Absolute: 2 10*3/uL (ref 0.7–3.1)
Lymphs: 24 %
MCH: 29.8 pg (ref 26.6–33.0)
Monocytes Absolute: 0.5 10*3/uL (ref 0.1–0.9)
Neutrophils: 69 %
RBC: 4.13 x10E6/uL (ref 3.77–5.28)
RPR Ser Ql: NONREACTIVE
Rh Factor: POSITIVE
Rubella Antibodies, IGG: 7.21 index (ref >0.99)
WBC: 8.4 10*3/uL (ref 3.4–10.8)

## 2022-08-01 LAB — HCV INTERPRETATION

## 2022-08-01 LAB — URINE CULTURE: Organism ID, Bacteria: NO GROWTH

## 2022-08-07 LAB — HORIZON CUSTOM: REPORT SUMMARY: NEGATIVE

## 2022-08-07 LAB — PANORAMA PRENATAL TEST FULL PANEL:PANORAMA TEST PLUS 5 ADDITIONAL MICRODELETIONS: FETAL FRACTION: 14.6

## 2022-08-21 ENCOUNTER — Encounter: Payer: Self-pay | Admitting: Obstetrics & Gynecology

## 2022-08-21 ENCOUNTER — Ambulatory Visit (INDEPENDENT_AMBULATORY_CARE_PROVIDER_SITE_OTHER): Payer: Medicaid Other | Admitting: Obstetrics & Gynecology

## 2022-08-21 VITALS — BP 117/69 | HR 84 | Wt 148.0 lb

## 2022-08-21 DIAGNOSIS — Z3480 Encounter for supervision of other normal pregnancy, unspecified trimester: Secondary | ICD-10-CM

## 2022-08-21 DIAGNOSIS — Z1379 Encounter for other screening for genetic and chromosomal anomalies: Secondary | ICD-10-CM | POA: Diagnosis not present

## 2022-08-21 NOTE — Progress Notes (Signed)
   LOW-RISK PREGNANCY VISIT Patient name: Latasha Thompson MRN [id number]  Date of birth: Oct 17, 2000 Chief Complaint:   Routine Prenatal Visit  History of Present Illness:   Latasha Thompson is a 22 y.o. G33P1001 female at [redacted]w[redacted]d with an Estimated Date of Delivery: 02/07/23 being seen today for ongoing management of a low-risk pregnancy.      07/30/2022    3:22 PM 02/16/2022    2:07 PM 12/18/2019    9:03 AM 09/08/2019   11:06 AM 07/14/2019   10:44 AM  Depression screen PHQ 2/9  Decreased Interest 0 0 1 2 0  Down, Depressed, Hopeless 0 0 0 0 0  PHQ - 2 Score 0 0 1 2 0  Altered sleeping 2 1 0 0 1  Tired, decreased energy 2 0 0 1 1  Change in appetite 2 0 0 0 2  Feeling bad or failure about yourself  0 0 0 0 0  Trouble concentrating 0 0 0 0 0  Moving slowly or fidgety/restless 0 0 0 0 0  Suicidal thoughts 0 0 0 0 0  PHQ-9 Score 6 1 1 3 4   Difficult doing work/chores  Not difficult at all   Not difficult at all    Today she reports no complaints. Contractions: Not present. Vag. Bleeding: None.  Movement: Present. denies leaking of fluid. Review of Systems:   Pertinent items are noted in HPI Denies abnormal vaginal discharge w/ itching/odor/irritation, headaches, visual changes, shortness of breath, chest pain, abdominal pain, severe nausea/vomiting, or problems with urination or bowel movements unless otherwise stated above. Pertinent History Reviewed:  Reviewed past medical,surgical, social, obstetrical and family history.  Reviewed problem list, medications and allergies.  Physical Assessment:   Vitals:   08/21/22 1510  BP: 117/69  Pulse: 84  Weight: 148 lb (67.1 kg)  Body mass index is 24.63 kg/m.        Physical Examination:   General appearance: Well appearing, and in no distress  Mental status: Alert, oriented to person, place, and time  Skin: Warm & dry  Respiratory: Normal respiratory effort, no distress  Abdomen: Soft, gravid, nontender  Pelvic: Cervical exam deferred          Extremities:  no edema, no calf tenderness bilaterally  Psych:  mood and affect appropriate  Fetal Status: Fetal Heart Rate (bpm): 150   Movement: Present    Chaperone: n/a    No results found for this or any previous visit (from the past 24 hour(s)).   Assessment & Plan:  1) Low-risk pregnancy G2P1001 at [redacted]w[redacted]d with an Estimated Date of Delivery: 02/07/23   Declined pap/pelvic today   Meds: No orders of the defined types were placed in this encounter.  Labs/procedures today: IT-2  Plan:  Continue routine obstetrical care, anatomy scan next visit Next visit: prefers in person    Reviewed: Preterm labor symptoms and general obstetric precautions including but not limited to vaginal bleeding, contractions, leaking of fluid and fetal movement were reviewed in detail with the patient.  All questions were answered.  Follow-up: Return in about 4 weeks (around 09/18/2022) for LROB visit.  Orders Placed This Encounter  Procedures   INTEGRATED 2    Myna Hidalgo, DO Attending Obstetrician & Gynecologist, [facility] for [employer], [facility]

## 2022-08-23 LAB — INTEGRATED 2
AFP MoM: 1.2
Alpha-Fetoprotein: 41.7 ng/mL
Crown Rump Length: 68.2 mm
DIA MoM: 1.23
DIA Value: 191.3 pg/mL
Estriol, Unconjugated: 1 ng/mL
Gest. Age on Collection Date: 12.9 weeks
Gestational Age: 16 weeks
Maternal Age at EDD: 22.3 yr
Nuchal Translucency (NT): 1.7 mm
Nuchal Translucency MoM: 1.1
Number of Fetuses: 1
PAPP-A MoM: 1.59
PAPP-A Value: 1820.8 ng/mL
Test Results:: NEGATIVE
Weight: 150 [lb_av]
Weight: 150 [lb_av]
hCG MoM: 2.25
hCG Value: 80.1 IU/mL
uE3 MoM: 1.05

## 2022-09-03 DIAGNOSIS — Z3481 Encounter for supervision of other normal pregnancy, first trimester: Secondary | ICD-10-CM | POA: Diagnosis not present

## 2022-09-11 ENCOUNTER — Other Ambulatory Visit: Payer: Medicaid Other

## 2022-09-11 ENCOUNTER — Encounter: Payer: Medicaid Other | Admitting: Women's Health

## 2022-09-19 ENCOUNTER — Ambulatory Visit (INDEPENDENT_AMBULATORY_CARE_PROVIDER_SITE_OTHER): Payer: Medicaid Other

## 2022-09-19 ENCOUNTER — Other Ambulatory Visit (HOSPITAL_COMMUNITY)
Admission: RE | Admit: 2022-09-19 | Discharge: 2022-09-19 | Disposition: A | Discharge status: Home / Self Care | Payer: Medicaid Other | Source: Ambulatory Visit | Attending: Obstetrics and Gynecology | Admitting: Obstetrics and Gynecology

## 2022-09-19 ENCOUNTER — Ambulatory Visit (INDEPENDENT_AMBULATORY_CARE_PROVIDER_SITE_OTHER): Payer: Medicaid Other | Admitting: Obstetrics and Gynecology

## 2022-09-19 VITALS — BP 101/65 | HR 79 | Wt 152.0 lb

## 2022-09-19 DIAGNOSIS — Z3A19 19 weeks gestation of pregnancy: Secondary | ICD-10-CM | POA: Diagnosis not present

## 2022-09-19 DIAGNOSIS — Z3482 Encounter for supervision of other normal pregnancy, second trimester: Secondary | ICD-10-CM

## 2022-09-19 DIAGNOSIS — Z124 Encounter for screening for malignant neoplasm of cervix: Secondary | ICD-10-CM | POA: Diagnosis not present

## 2022-09-19 DIAGNOSIS — Z3480 Encounter for supervision of other normal pregnancy, unspecified trimester: Secondary | ICD-10-CM

## 2022-09-19 DIAGNOSIS — Z363 Encounter for antenatal screening for malformations: Secondary | ICD-10-CM

## 2022-09-19 NOTE — Progress Notes (Signed)
   PRENATAL VISIT NOTE  Subjective:  LAILI MANARY is a 22 y.o. G2P1001 at [redacted]w[redacted]d being seen today for ongoing prenatal care.  She is currently monitored for the following issues for this low-risk pregnancy and has Encounter for supervision of normal pregnancy, antepartum on their problem list.  Patient reports no complaints.  Contractions: Not present. Vag. Bleeding: None.  Movement: Present. Denies leaking of fluid.   The following portions of the patient's history were reviewed and updated as appropriate: allergies, current medications, past family history, past medical history, past social history, past surgical history and problem list.   Objective:   Vitals:   09/19/22 1557  BP: 101/65  Pulse: 79  Weight: 152 lb (68.9 kg)    Fetal Status:     Movement: Present     General:  Alert, oriented and cooperative. Patient is in no acute distress.  Skin: Skin is warm and dry. No rash noted.   Cardiovascular: Normal heart rate noted  Respiratory: Normal respiratory effort, no problems with respiration noted  Abdomen: Soft, gravid, appropriate for gestational age.  Pain/Pressure: Absent     Pelvic: Cervical exam deferred        Extremities: Normal range of motion.  Edema: None  Mental Status: Normal mood and affect. Normal behavior. Normal judgment and thought content.   Assessment and Plan:  Pregnancy: G2P1001 at [redacted]w[redacted]d 1. Supervision of other normal pregnancy, antepartum BP and FHR normal  Feeling regular fetal movement Nausea relieved from last visit U/s today 19+6 wks,breech,cx 3 cm anterior placenta gr 0,SVP of fluid 5.8 cm,normal ovaries,FHR 148 bpm,EFW 315 g 42%,anatomy complete,no obvious abnormalities   2. Cervical cancer screening  - Cytology - PAP( [place])  3. [redacted] weeks gestation of pregnancy Anticipatory guidance regarding upcoming appts    Preterm labor symptoms and general obstetric precautions including but not limited to vaginal bleeding, contractions,  leaking of fluid and fetal movement were reviewed in detail with the patient. Please refer to After Visit Summary for other counseling recommendations.    Albertine Grates, FNP

## 2022-09-19 NOTE — Progress Notes (Signed)
[place] 19+6 wks,breech,cx 3 cm anterior placenta gr 0,SVP of fluid 5.8 cm,normal ovaries,FHR 148 bpm,EFW 315 g 42%,anatomy complete,no obvious abnormalities

## 2022-09-21 LAB — CYTOLOGY - PAP: Diagnosis: NEGATIVE

## 2022-10-17 ENCOUNTER — Encounter: Payer: Self-pay | Admitting: Women's Health

## 2022-10-17 ENCOUNTER — Ambulatory Visit (INDEPENDENT_AMBULATORY_CARE_PROVIDER_SITE_OTHER): Payer: Medicaid Other | Admitting: Women's Health

## 2022-10-17 VITALS — BP 113/59 | HR 82 | Wt 158.5 lb

## 2022-10-17 DIAGNOSIS — Z3482 Encounter for supervision of other normal pregnancy, second trimester: Secondary | ICD-10-CM

## 2022-10-17 DIAGNOSIS — Z3A23 23 weeks gestation of pregnancy: Secondary | ICD-10-CM

## 2022-10-17 DIAGNOSIS — Z3480 Encounter for supervision of other normal pregnancy, unspecified trimester: Secondary | ICD-10-CM

## 2022-10-17 NOTE — Patient Instructions (Signed)
Latasha Thompson, thank you for choosing our office today! We appreciate the opportunity to meet your healthcare needs. You may receive a short survey by mail, e-mail, or through [employer]. If you are happy with your care we would appreciate if you could take just a few minutes to complete the survey questions. We read all of your comments and take your feedback very seriously. Thank you again for choosing our office.  Center for [employer] Team at [facility]  [facility] at [facility] ([address] [place], [place] [address]) Entrance C, located off of [address] Free 24/7 valet parking   You will have your sugar test next visit.  Please do not eat or drink anything after midnight the night before you come, not even water.  You will be here for at least two hours.  Please make an appointment online for the bloodwork at [email or web address] for 8:00am (or as close to this as possible). Make sure you select the [facility] service center.   CLASSES: Go to [email or web address] to register for classes (childbirth, breastfeeding, waterbirth, infant CPR, daddy bootcamp, etc.)  Call the office [phone]) or go to [facility] if: You begin to have strong, frequent contractions Your water breaks.  Sometimes it is a big gush of fluid, sometimes it is just a trickle that keeps getting your panties wet or running down your legs You have vaginal bleeding.  It is normal to have a small amount of spotting if your cervix was checked.  You don't feel your baby moving like normal.  If you don't, get you something to eat and drink and lay down and focus on feeling your baby move.   If your baby is still not moving like normal, you should call the office or go to [facility].  Call the office [phone]) or go to [facility] hospital for these signs of pre-eclampsia: Severe headache that does not go away with Tylenol Visual changes- seeing spots, double, blurred vision Pain under your right breast or upper  abdomen that does not go away with Tums or heartburn medicine Nausea and/or vomiting Severe swelling in your hands, feet, and face    [place] Pediatricians/Family Doctors [facility]): [address] [place]lette Ribas, [phone]           [facility]: [address], [phone]                [facility] Family Medicine [facility]): [address], [phone]  [facility] Department: [address], [place], [phone]    [facility] Pediatricians/Family Doctors [facility]): [address], [phone] [facility]: [address] [place], [phone] [facility]: [address], [phone]  [facility] Doctors  [facility]): [phone] [facility]: [address] [phone]   [facility] Doctors [facility]: [address] [phone]  [facility] Doctors  [facility]: [phone], [phone]  Home Blood Pressure Monitoring for Patients   Your provider has recommended that you check your blood pressure (BP) at least once a week at home. If you do not have a blood pressure cuff at home, one will be provided for you. Contact your provider if you have not received your monitor within 1 week.   Helpful Tips for Accurate Home Blood Pressure Checks  Don't smoke, exercise, or drink caffeine 30 minutes before checking  your BP Use the restroom before checking your BP (a full bladder can raise your pressure) Relax in a comfortable upright chair Feet on the ground Left arm resting comfortably on a flat surface at the level of your heart Legs uncrossed Back supported Sit quietly and don't talk Place the cuff on your bare arm Adjust snuggly, so that only two fingertips can fit between your skin and the top of the cuff Check 2 readings separated by at least one  minute Keep a log of your BP readings For a visual, please reference this diagram: http://ccnc.care/bpdiagram  Provider Name: [facility]     Phone: [phone]  Zone 1: ALL CLEAR  Continue to monitor your symptoms:  BP reading is less than 140 (top number) or less than 90 (bottom number)  No right upper stomach pain No headaches or seeing spots No feeling nauseated or throwing up No swelling in face and hands  Zone 2: CAUTION Call your doctor's office for any of the following:  BP reading is greater than 140 (top number) or greater than 90 (bottom number)  Stomach pain under your ribs in the middle or right side Headaches or seeing spots Feeling nauseated or throwing up Swelling in face and hands  Zone 3: EMERGENCY  Seek immediate medical care if you have any of the following:  BP reading is greater than160 (top number) or greater than 110 (bottom number) Severe headaches not improving with Tylenol Serious difficulty catching your breath Any worsening symptoms from Zone 2   Second Trimester of Pregnancy The second trimester is from week 13 through week 28, months 4 through 6. The second trimester is often a time when you feel your best. Your body has also adjusted to being pregnant, and you begin to feel better physically. Usually, morning sickness has lessened or quit completely, you may have more energy, and you may have an increase in appetite. The second trimester is also a time when the fetus is growing rapidly. At the end of the sixth month, the fetus is about 9 inches long and weighs about 1 pounds. You will likely begin to feel the baby move (quickening) between 18 and 20 weeks of the pregnancy. BODY CHANGES Your body goes through many changes during pregnancy. The changes vary from woman to woman.  Your weight will continue to increase. You will notice your lower abdomen bulging out. You may begin to get stretch marks on your hips, abdomen, and breasts. You may  develop headaches that can be relieved by medicines approved by your health care provider. You may urinate more often because the fetus is pressing on your bladder. You may develop or continue to have heartburn as a result of your pregnancy. You may develop constipation because certain hormones are causing the muscles that push waste through your intestines to slow down. You may develop hemorrhoids or swollen, bulging veins (varicose veins). You may have back pain because of the weight gain and pregnancy hormones relaxing your joints between the bones in your pelvis and as a result of a shift in weight and the muscles that support your balance. Your breasts will continue to grow and be tender. Your gums may bleed and may be sensitive to brushing and flossing. Dark spots or blotches (chloasma, mask of pregnancy) may develop on your face. This will likely fade after the baby is born. A dark line from your belly button to the pubic area (linea nigra) may appear. This will likely fade after the  baby is born. You may have changes in your hair. These can include thickening of your hair, rapid growth, and changes in texture. Some women also have hair loss during or after pregnancy, or hair that feels dry or thin. Your hair will most likely return to normal after your baby is born. WHAT TO EXPECT AT YOUR PRENATAL VISITS During a routine prenatal visit: You will be weighed to make sure you and the fetus are growing normally. Your blood pressure will be taken. Your abdomen will be measured to track your baby's growth. The fetal heartbeat will be listened to. Any test results from the previous visit will be discussed. Your health care provider may ask you: How you are feeling. If you are feeling the baby move. If you have had any abnormal symptoms, such as leaking fluid, bleeding, severe headaches, or abdominal cramping. If you have any questions. Other tests that may be performed during your second  trimester include: Blood tests that check for: Low iron levels (anemia). Gestational diabetes (between 24 and 28 weeks). Rh antibodies. Urine tests to check for infections, diabetes, or protein in the urine. An ultrasound to confirm the proper growth and development of the baby. An amniocentesis to check for possible genetic problems. Fetal screens for spina bifida and Down syndrome. HOME CARE INSTRUCTIONS  Avoid all smoking, herbs, alcohol, and unprescribed drugs. These chemicals affect the formation and growth of the baby. Follow your health care provider's instructions regarding medicine use. There are medicines that are either safe or unsafe to take during pregnancy. Exercise only as directed by your health care provider. Experiencing uterine cramps is a good sign to stop exercising. Continue to eat regular, healthy meals. Wear a good support bra for breast tenderness. Do not use hot tubs, steam rooms, or saunas. Wear your seat belt at all times when driving. Avoid raw meat, uncooked cheese, cat litter boxes, and soil used by cats. These carry germs that can cause birth defects in the baby. Take your prenatal vitamins. Try taking a stool softener (if your health care provider approves) if you develop constipation. Eat more high-fiber foods, such as fresh vegetables or fruit and whole grains. Drink plenty of fluids to keep your urine clear or pale yellow. Take warm sitz baths to soothe any pain or discomfort caused by hemorrhoids. Use hemorrhoid cream if your health care provider approves. If you develop varicose veins, wear support hose. Elevate your feet for 15 minutes, 3-4 times a day. Limit salt in your diet. Avoid heavy lifting, wear low heel shoes, and practice good posture. Rest with your legs elevated if you have leg cramps or low back pain. Visit your dentist if you have not gone yet during your pregnancy. Use a soft toothbrush to brush your teeth and be gentle when you floss. A  sexual relationship may be continued unless your health care provider directs you otherwise. Continue to go to all your prenatal visits as directed by your health care provider. SEEK MEDICAL CARE IF:  You have dizziness. You have mild pelvic cramps, pelvic pressure, or nagging pain in the abdominal area. You have persistent nausea, vomiting, or diarrhea. You have a bad smelling vaginal discharge. You have pain with urination. SEEK IMMEDIATE MEDICAL CARE IF:  You have a fever. You are leaking fluid from your vagina. You have spotting or bleeding from your vagina. You have severe abdominal cramping or pain. You have rapid weight gain or loss. You have shortness of breath with chest pain. You  notice sudden or extreme swelling of your face, hands, ankles, feet, or legs. You have not felt your baby move in over an hour. You have severe headaches that do not go away with medicine. You have vision changes. Document Released: 01/16/2001 Document Revised: 01/27/2013 Document Reviewed: 03/25/2012 [facility] Patient Information 2015 [place], [place]. This information is not intended to replace advice given to you by your health care provider. Make sure you discuss any questions you have with your health care provider.

## 2022-10-17 NOTE — Progress Notes (Signed)
LOW-RISK PREGNANCY VISIT Patient name: Latasha Thompson MRN [id number]  Date of birth: 2000-10-31 Chief Complaint:   Routine Prenatal Visit  History of Present Illness:   Latasha Thompson is a 22 y.o. G15P1001 female at [redacted]w[redacted]d with an Estimated Date of Delivery: 02/07/23 being seen today for ongoing management of a low-risk pregnancy.   Today she reports  thinks she had some braxton hicks the other day . Contractions: Not present. Vag. Bleeding: None.  Movement: Present. denies leaking of fluid.     07/30/2022    3:22 PM 02/16/2022    2:07 PM 12/18/2019    9:03 AM 09/08/2019   11:06 AM 07/14/2019   10:44 AM  Depression screen PHQ 2/9  Decreased Interest 0 0 1 2 0  Down, Depressed, Hopeless 0 0 0 0 0  PHQ - 2 Score 0 0 1 2 0  Altered sleeping 2 1 0 0 1  Tired, decreased energy 2 0 0 1 1  Change in appetite 2 0 0 0 2  Feeling bad or failure about yourself  0 0 0 0 0  Trouble concentrating 0 0 0 0 0  Moving slowly or fidgety/restless 0 0 0 0 0  Suicidal thoughts 0 0 0 0 0  PHQ-9 Score 6 1 1 3 4   Difficult doing work/chores  Not difficult at all   Not difficult at all        07/30/2022    3:23 PM 02/16/2022    2:07 PM 12/18/2019    9:03 AM 09/08/2019   11:06 AM  GAD 7 : Generalized Anxiety Score  Nervous, Anxious, on Edge 0 0 0 1  Control/stop worrying 1 1 0 0  Worry too much - different things 1 1 0 0  Trouble relaxing 1 0 0 0  Restless 0 0 0 0  Easily annoyed or irritable 2 1 0 0  Afraid - awful might happen 0 0 0 0  Total GAD 7 Score 5 3 0 1  Anxiety Difficulty  Not difficult at all        Review of Systems:   Pertinent items are noted in HPI Denies abnormal vaginal discharge w/ itching/odor/irritation, headaches, visual changes, shortness of breath, chest pain, abdominal pain, severe nausea/vomiting, or problems with urination or bowel movements unless otherwise stated above. Pertinent History Reviewed:  Reviewed past medical,surgical, social, obstetrical and family history.   Reviewed problem list, medications and allergies. Physical Assessment:   Vitals:   10/17/22 1016  BP: (!) 113/59  Pulse: 82  Weight: 158 lb 8 oz (71.9 kg)  Body mass index is 26.38 kg/m.        Physical Examination:   General appearance: Well appearing, and in no distress  Mental status: Alert, oriented to person, place, and time  Skin: Warm & dry  Cardiovascular: Normal heart rate noted  Respiratory: Normal respiratory effort, no distress  Abdomen: Soft, gravid, nontender  Pelvic: Cervical exam deferred         Extremities: Edema: None  Fetal Status: Fetal Heart Rate (bpm): 143 Fundal Height: 23 cm Movement: Present    Chaperone: N/A   No results found for this or any previous visit (from the past 24 hour(s)).  Assessment & Plan:  1) Low-risk pregnancy G2P1001 at [redacted]w[redacted]d with an Estimated Date of Delivery: 02/07/23    Meds: No orders of the defined types were placed in this encounter.  Labs/procedures today: none  Plan:  Continue routine obstetrical care  Next  visit: prefers will be in person for pn2     Reviewed: Preterm labor symptoms and general obstetric precautions including but not limited to vaginal bleeding, contractions, leaking of fluid and fetal movement were reviewed in detail with the patient.  All questions were answered. Does have home bp cuff. Office bp cuff given: not applicable. Check bp weekly, let us know if consistently >140 and/or >90.  Follow-up: Return in about 4 weeks (around 11/14/2022) for LROB, PN2, CNM, in person.  No future appointments.  No orders of the defined types were placed in this encounter.  Cheral Marker CNM, [facility] 10/17/2022 10:35 AM

## 2022-11-15 ENCOUNTER — Encounter: Payer: Self-pay | Admitting: Obstetrics & Gynecology

## 2022-11-15 ENCOUNTER — Ambulatory Visit (INDEPENDENT_AMBULATORY_CARE_PROVIDER_SITE_OTHER): Payer: Medicaid Other | Admitting: Obstetrics & Gynecology

## 2022-11-15 ENCOUNTER — Other Ambulatory Visit: Payer: Medicaid Other

## 2022-11-15 VITALS — BP 105/57 | HR 82 | Wt 165.0 lb

## 2022-11-15 DIAGNOSIS — Z3A28 28 weeks gestation of pregnancy: Secondary | ICD-10-CM

## 2022-11-15 DIAGNOSIS — Z23 Encounter for immunization: Secondary | ICD-10-CM | POA: Diagnosis not present

## 2022-11-15 DIAGNOSIS — Z3A27 27 weeks gestation of pregnancy: Secondary | ICD-10-CM

## 2022-11-15 DIAGNOSIS — Z131 Encounter for screening for diabetes mellitus: Secondary | ICD-10-CM | POA: Diagnosis not present

## 2022-11-15 DIAGNOSIS — Z3480 Encounter for supervision of other normal pregnancy, unspecified trimester: Secondary | ICD-10-CM | POA: Diagnosis not present

## 2022-11-15 DIAGNOSIS — Z3483 Encounter for supervision of other normal pregnancy, third trimester: Secondary | ICD-10-CM

## 2022-11-15 DIAGNOSIS — Z1332 Encounter for screening for maternal depression: Secondary | ICD-10-CM

## 2022-11-15 NOTE — Progress Notes (Signed)
   LOW-RISK PREGNANCY VISIT Patient name: Latasha Thompson MRN [id number]  Date of birth: September 01, 2000 Chief Complaint:   Routine Prenatal Visit (PN2/Tdap given today)  History of Present Illness:   Latasha Thompson is a 22 y.o. G14P1001 female at [redacted]w[redacted]d with an Estimated Date of Delivery: 02/07/23 being seen today for ongoing management of a low-risk pregnancy.     11/15/2022    9:22 AM 07/30/2022    3:22 PM 02/16/2022    2:07 PM 12/18/2019    9:03 AM 09/08/2019   11:06 AM  Depression screen PHQ 2/9  Decreased Interest 0 0 0 1 2  Down, Depressed, Hopeless 0 0 0 0 0  PHQ - 2 Score 0 0 0 1 2  Altered sleeping 0 2 1 0 0  Tired, decreased energy 0 2 0 0 1  Change in appetite 0 2 0 0 0  Feeling bad or failure about yourself  0 0 0 0 0  Trouble concentrating 0 0 0 0 0  Moving slowly or fidgety/restless 0 0 0 0 0  Suicidal thoughts 0 0 0 0 0  PHQ-9 Score 0 6 1 1 3   Difficult doing work/chores Not difficult at all  Not difficult at all      Today she reports no complaints. Contractions: Not present. Vag. Bleeding: None.  Movement: Present. denies leaking of fluid. Review of Systems:   Pertinent items are noted in HPI Denies abnormal vaginal discharge w/ itching/odor/irritation, headaches, visual changes, shortness of breath, chest pain, abdominal pain, severe nausea/vomiting, or problems with urination or bowel movements unless otherwise stated above. Pertinent History Reviewed:  Reviewed past medical,surgical, social, obstetrical and family history.  Reviewed problem list, medications and allergies.  Physical Assessment:   Vitals:   11/15/22 0914  BP: (!) 105/57  Pulse: 82  Weight: 165 lb (74.8 kg)  Body mass index is 27.46 kg/m.        Physical Examination:   General appearance: Well appearing, and in no distress  Mental status: Alert, oriented to person, place, and time  Skin: Warm & dry  Respiratory: Normal respiratory effort, no distress  Abdomen: Soft, gravid, nontender  Pelvic:  Cervical exam deferred         Extremities: Edema: None  Psych:  mood and affect appropriate  Fetal Status: Fetal Heart Rate (bpm): 135 Fundal Height: 26 cm Movement: Present    Chaperone: n/a    No results found for this or any previous visit (from the past 24 hour(s)).   Assessment & Plan:  1) Low-risk pregnancy G2P1001 at [redacted]w[redacted]d with an Estimated Date of Delivery: 02/07/23   Meds: No orders of the defined types were placed in this encounter.  Labs/procedures today: PN2  Plan:  Continue routine obstetrical care  Next visit: prefers in person    Reviewed: Preterm labor symptoms and general obstetric precautions including but not limited to vaginal bleeding, contractions, leaking of fluid and fetal movement were reviewed in detail with the patient.  All questions were answered. Pt has home bp cuff. Check bp weekly, let us know if >140/90.   Follow-up: Return in about 2 weeks (around 11/29/2022) for LROB visit.  Orders Placed This Encounter  Procedures   Tdap vaccine greater than or equal to 7yo IM    Myna Hidalgo, DO Attending Obstetrician & Gynecologist, [facility] for [employer], [facility]

## 2022-11-16 LAB — RPR: RPR Ser Ql: NONREACTIVE

## 2022-11-16 LAB — CBC
Hematocrit: 32.6 % — ABNORMAL LOW (ref 34.0–46.6)
Hemoglobin: 11 g/dL — ABNORMAL LOW (ref 11.1–15.9)
MCH: 31.9 pg (ref 26.6–33.0)
MCHC: 33.7 g/dL (ref 31.5–35.7)
MCV: 95 fL (ref 79–97)
Platelets: 208 10*3/uL (ref 150–450)
RBC: 3.45 x10E6/uL — ABNORMAL LOW (ref 3.77–5.28)
RDW: 12.4 % (ref 11.7–15.4)
WBC: 8.7 10*3/uL (ref 3.4–10.8)

## 2022-11-16 LAB — GLUCOSE TOLERANCE, 2 HOURS W/ 1HR
Glucose, 1 hour: 113 mg/dL (ref 70–179)
Glucose, 2 hour: 112 mg/dL (ref 70–152)
Glucose, Fasting: 77 mg/dL (ref 70–91)

## 2022-11-16 LAB — ANTIBODY SCREEN: Antibody Screen: NEGATIVE

## 2022-11-16 LAB — HIV ANTIBODY (ROUTINE TESTING W REFLEX): HIV Screen 4th Generation wRfx: NONREACTIVE

## 2022-11-29 ENCOUNTER — Ambulatory Visit (INDEPENDENT_AMBULATORY_CARE_PROVIDER_SITE_OTHER): Payer: Medicaid Other | Admitting: Advanced Practice Midwife

## 2022-11-29 ENCOUNTER — Encounter: Payer: Self-pay | Admitting: Advanced Practice Midwife

## 2022-11-29 VITALS — BP 104/61 | HR 92 | Wt 165.0 lb

## 2022-11-29 DIAGNOSIS — Z3A30 30 weeks gestation of pregnancy: Secondary | ICD-10-CM

## 2022-11-29 DIAGNOSIS — Z3483 Encounter for supervision of other normal pregnancy, third trimester: Secondary | ICD-10-CM

## 2022-11-29 DIAGNOSIS — Z3480 Encounter for supervision of other normal pregnancy, unspecified trimester: Secondary | ICD-10-CM

## 2022-11-29 NOTE — Patient Instructions (Signed)
Kenton Kingfisher, I greatly value your feedback.  If you receive a survey following your visit with [place] today, we appreciate you taking the time to fill it out.  Thanks, Cathie Beams, DNP, CNM  [facility] HAS MOVED!!! It is now [facility] ([address] [place], [place] [address]) Entrance located off of [address] Free 24/7 valet parking   Go to [email or web address] to register for FREE online childbirth classes    Call the office [phone]) or go to [facility] if: You begin to have strong, frequent contractions Your water breaks.  Sometimes it is a big gush of fluid, sometimes it is just a trickle that keeps getting your panties wet or running down your legs You have vaginal bleeding.  It is normal to have a small amount of spotting if your cervix was checked.  You don't feel your baby moving like normal.  If you don't, get you something to eat and drink and lay down and focus on feeling your baby move.  You should feel at least 10 movements in 2 hours.  If you don't, you should call the office or go to [facility].   Home Blood Pressure Monitoring for Patients   Your provider has recommended that you check your blood pressure (BP) at least once a week at home. If you do not have a blood pressure cuff at home, one will be provided for you. Contact your provider if you have not received your monitor within 1 week.   Helpful Tips for Accurate Home Blood Pressure Checks  Don't smoke, exercise, or drink caffeine 30 minutes before checking your BP Use the restroom before checking your BP (a full bladder can raise your pressure) Relax in a comfortable upright chair Feet on the ground Left arm resting comfortably on a flat surface at the level of your heart Legs uncrossed Back supported Sit quietly and don't talk Place the cuff on your bare arm Adjust snuggly, so that only two fingertips can fit between your skin and the top  of the cuff Check 2 readings separated by at least one minute Keep a log of your BP readings For a visual, please reference this diagram: http://ccnc.care/bpdiagram  Provider Name: [facility]     Phone: [phone]  Zone 1: ALL CLEAR  Continue to monitor your symptoms:  BP reading is less than 140 (top number) or less than 90 (bottom number)  No right upper stomach pain No headaches or seeing spots No feeling nauseated or throwing up No swelling in face and hands  Zone 2: CAUTION Call your doctor's office for any of the following:  BP reading is greater than 140 (top number) or greater than 90 (bottom number)  Stomach pain under your ribs in the middle or right side Headaches or seeing spots Feeling nauseated or throwing up Swelling in face and hands  Zone 3: EMERGENCY  Seek immediate medical care if you have any of the following:  BP reading is greater than160 (top number) or greater than 110 (bottom number) Severe headaches not improving with Tylenol Serious difficulty catching your breath Any worsening symptoms from Zone 2

## 2022-11-29 NOTE — Progress Notes (Signed)
   LOW-RISK PREGNANCY VISIT Patient name: Latasha Thompson MRN [id number]  Date of birth: 08-03-2000 Chief Complaint:   Routine Prenatal Visit  History of Present Illness:   Latasha Thompson is a 22 y.o. G57P1001 female at [redacted]w[redacted]d with an Estimated Date of Delivery: 02/07/23 being seen today for ongoing management of a low-risk pregnancy.  Today she reports no complaints. Contractions: Not present.  .  Movement: Present. denies leaking of fluid. Review of Systems:   Pertinent items are noted in HPI Denies abnormal vaginal discharge w/ itching/odor/irritation, headaches, visual changes, shortness of breath, chest pain, abdominal pain, severe nausea/vomiting, or problems with urination or bowel movements unless otherwise stated above. Pertinent History Reviewed:  Reviewed past medical,surgical, social, obstetrical and family history.  Reviewed problem list, medications and allergies. Physical Assessment:   Vitals:   11/29/22 1336  BP: 104/61  Pulse: 92  Weight: 165 lb (74.8 kg)  Body mass index is 27.46 kg/m.        Physical Examination:   General appearance: Well appearing, and in no distress  Mental status: Alert, oriented to person, place, and time  Skin: Warm & dry  Cardiovascular: Normal heart rate noted  Respiratory: Normal respiratory effort, no distress  Abdomen: Soft, gravid, nontender  Pelvic: Cervical exam deferred         Extremities: Edema: None Chaperone:  N/A   Fetal Status:     Movement: Present      No results found for this or any previous visit (from the past 24 hour(s)).  Assessment & Plan:    Pregnancy: G2P1001 at [redacted]w[redacted]d 1. Supervision of other normal pregnancy, antepartum   2. [redacted] weeks gestation of pregnancy      Meds: No orders of the defined types were placed in this encounter.  Labs/procedures today: none  Plan:  Continue routine obstetrical care  Next visit: prefers in person    Reviewed: Preterm labor symptoms and general obstetric precautions  including but not limited to vaginal bleeding, contractions, leaking of fluid and fetal movement were reviewed in detail with the patient.  All questions were answered. Has home bp cuff.. Check bp weekly, let us know if >140/90.   Follow-up: No follow-ups on file.  No future appointments.  No orders of the defined types were placed in this encounter.  Jacklyn Shell DNP, CNM 11/29/2022 1:52 PM

## 2022-12-13 ENCOUNTER — Encounter: Payer: Self-pay | Admitting: Advanced Practice Midwife

## 2022-12-13 ENCOUNTER — Ambulatory Visit (INDEPENDENT_AMBULATORY_CARE_PROVIDER_SITE_OTHER): Payer: Medicaid Other | Admitting: Advanced Practice Midwife

## 2022-12-13 VITALS — BP 106/66 | HR 73 | Wt 168.0 lb

## 2022-12-13 DIAGNOSIS — Z3480 Encounter for supervision of other normal pregnancy, unspecified trimester: Secondary | ICD-10-CM

## 2022-12-13 DIAGNOSIS — Z3483 Encounter for supervision of other normal pregnancy, third trimester: Secondary | ICD-10-CM

## 2022-12-13 DIAGNOSIS — Z3A32 32 weeks gestation of pregnancy: Secondary | ICD-10-CM

## 2022-12-13 NOTE — Progress Notes (Signed)
   LOW-RISK PREGNANCY VISIT Patient name: Latasha Thompson MRN [id number]  Date of birth: 2000/03/06 Chief Complaint:   Routine Prenatal Visit  History of Present Illness:   Latasha Thompson is a 22 y.o. G37P1001 female at [redacted]w[redacted]d with an Estimated Date of Delivery: 02/07/23 being seen today for ongoing management of a low-risk pregnancy.  Today she reports no complaints.  . Vag. Bleeding: None.  Movement: Present. denies leaking of fluid. Review of Systems:   Pertinent items are noted in HPI Denies abnormal vaginal discharge w/ itching/odor/irritation, headaches, visual changes, shortness of breath, chest pain, abdominal pain, severe nausea/vomiting, or problems with urination or bowel movements unless otherwise stated above. Pertinent History Reviewed:  Reviewed past medical,surgical, social, obstetrical and family history.  Reviewed problem list, medications and allergies. Physical Assessment:   Vitals:   12/13/22 1336  BP: 106/66  Pulse: 73  Weight: 168 lb (76.2 kg)  Body mass index is 27.96 kg/m.        Physical Examination:   General appearance: Well appearing, and in no distress  Mental status: Alert, oriented to person, place, and time  Skin: Warm & dry  Cardiovascular: Normal heart rate noted  Respiratory: Normal respiratory effort, no distress  Abdomen: Soft, gravid, nontender  Pelvic: Cervical exam deferred         Extremities: Edema: None Chaperone:  N/A   Fetal Status: Fetal Heart Rate (bpm): 148 Fundal Height: 33 cm Movement: Present      No results found for this or any previous visit (from the past 24 hour(s)).  Assessment & Plan:    Pregnancy: G2P1001 at [redacted]w[redacted]d 1. [redacted] weeks gestation of pregnancy   2. Supervision of other normal pregnancy, antepartum      Meds: No orders of the defined types were placed in this encounter.  Labs/procedures today: none  Plan:  Continue routine obstetrical care  Next visit: prefers in person    Reviewed: Preterm labor  symptoms and general obstetric precautions including but not limited to vaginal bleeding, contractions, leaking of fluid and fetal movement were reviewed in detail with the patient.  All questions were answered. Has home bp cuff.. Check bp weekly, let us know if >140/90.   Follow-up: Return in about 2 weeks (around 12/27/2022) for LROB.  No future appointments.  No orders of the defined types were placed in this encounter.  Jacklyn Shell DNP, CNM 12/13/2022 1:52 PM

## 2022-12-13 NOTE — Patient Instructions (Signed)
Kenton Kingfisher, I greatly value your feedback.  If you receive a survey following your visit with [place] today, we appreciate you taking the time to fill it out.  Thanks, Cathie Beams, DNP, CNM  [facility] HAS MOVED!!! It is now [facility] ([address] [place], [place] [address]) Entrance located off of [address] Free 24/7 valet parking   Go to [email or web address] to register for FREE online childbirth classes    Call the office [phone]) or go to [facility] if: You begin to have strong, frequent contractions Your water breaks.  Sometimes it is a big gush of fluid, sometimes it is just a trickle that keeps getting your panties wet or running down your legs You have vaginal bleeding.  It is normal to have a small amount of spotting if your cervix was checked.  You don't feel your baby moving like normal.  If you don't, get you something to eat and drink and lay down and focus on feeling your baby move.  You should feel at least 10 movements in 2 hours.  If you don't, you should call the office or go to [facility].   Home Blood Pressure Monitoring for Patients   Your provider has recommended that you check your blood pressure (BP) at least once a week at home. If you do not have a blood pressure cuff at home, one will be provided for you. Contact your provider if you have not received your monitor within 1 week.   Helpful Tips for Accurate Home Blood Pressure Checks  Don't smoke, exercise, or drink caffeine 30 minutes before checking your BP Use the restroom before checking your BP (a full bladder can raise your pressure) Relax in a comfortable upright chair Feet on the ground Left arm resting comfortably on a flat surface at the level of your heart Legs uncrossed Back supported Sit quietly and don't talk Place the cuff on your bare arm Adjust snuggly, so that only two fingertips can fit between your skin and the top  of the cuff Check 2 readings separated by at least one minute Keep a log of your BP readings For a visual, please reference this diagram: http://ccnc.care/bpdiagram  Provider Name: [facility]     Phone: [phone]  Zone 1: ALL CLEAR  Continue to monitor your symptoms:  BP reading is less than 140 (top number) or less than 90 (bottom number)  No right upper stomach pain No headaches or seeing spots No feeling nauseated or throwing up No swelling in face and hands  Zone 2: CAUTION Call your doctor's office for any of the following:  BP reading is greater than 140 (top number) or greater than 90 (bottom number)  Stomach pain under your ribs in the middle or right side Headaches or seeing spots Feeling nauseated or throwing up Swelling in face and hands  Zone 3: EMERGENCY  Seek immediate medical care if you have any of the following:  BP reading is greater than160 (top number) or greater than 110 (bottom number) Severe headaches not improving with Tylenol Serious difficulty catching your breath Any worsening symptoms from Zone 2

## 2022-12-26 ENCOUNTER — Ambulatory Visit (INDEPENDENT_AMBULATORY_CARE_PROVIDER_SITE_OTHER): Payer: Medicaid Other | Admitting: Women's Health

## 2022-12-26 ENCOUNTER — Encounter: Payer: Self-pay | Admitting: Women's Health

## 2022-12-26 VITALS — BP 110/66 | HR 91 | Wt 170.0 lb

## 2022-12-26 DIAGNOSIS — Z3480 Encounter for supervision of other normal pregnancy, unspecified trimester: Secondary | ICD-10-CM

## 2022-12-26 DIAGNOSIS — Z3483 Encounter for supervision of other normal pregnancy, third trimester: Secondary | ICD-10-CM

## 2022-12-26 DIAGNOSIS — Z3A33 33 weeks gestation of pregnancy: Secondary | ICD-10-CM

## 2022-12-26 NOTE — Patient Instructions (Signed)
Barron Alvine, thank you for choosing our office today! We appreciate the opportunity to meet your healthcare needs. You may receive a short survey by mail, e-mail, or through [employer]. If you are happy with your care we would appreciate if you could take just a few minutes to complete the survey questions. We read all of your comments and take your feedback very seriously. Thank you again for choosing our office.  Center for [employer] Team at [facility]  [facility] at [facility] ([address] [place], [place] [address]) Entrance C, located off of [address] Free 24/7 valet parking   CLASSES: Go to [email or web address] to register for classes (childbirth, breastfeeding, waterbirth, infant CPR, daddy bootcamp, etc.)  Call the office [phone]) or go to [facility] if: You begin to have strong, frequent contractions Your water breaks.  Sometimes it is a big gush of fluid, sometimes it is just a trickle that keeps getting your panties wet or running down your legs You have vaginal bleeding.  It is normal to have a small amount of spotting if your cervix was checked.  You don't feel your baby moving like normal.  If you don't, get you something to eat and drink and lay down and focus on feeling your baby move.   If your baby is still not moving like normal, you should call the office or go to [facility].  Call the office [phone]) or go to [facility] hospital for these signs of pre-eclampsia: Severe headache that does not go away with Tylenol Visual changes- seeing spots, double, blurred vision Pain under your right breast or upper abdomen that does not go away with Tums or heartburn medicine Nausea and/or vomiting Severe swelling in your hands, feet, and face   Tdap Vaccine It is recommended that you get the Tdap vaccine during the third trimester of EACH pregnancy to help protect your baby from getting pertussis (whooping cough) 27-36 weeks is the BEST time to do this  so that you can pass the protection on to your baby. During pregnancy is better than after pregnancy, but if you are unable to get it during pregnancy it will be offered at the hospital.  You can get this vaccine with [place], at the health department, your family doctor, or some local pharmacies Everyone who will be around your baby should also be up-to-date on their vaccines before the baby comes. Adults (who are not pregnant) only need 1 dose of Tdap during adulthood.   [facility] Pediatricians/Family Doctors [facility]): [address] [place], [phone]           [facility]: [address], [phone]                [facility]): [address], [phone] (call to ask if accepting patients) [facility]: [address], [employer], [phone]    [facility] Pediatricians/Family Doctors [facility]): [phone], [phone] [facility]: [address] [place], [phone] [facility]: [address], [phone]  [facility] Doctors  [facility]): [phone] [facility]: [address] [phone]   [facility] Doctors [facility]: [address] [phone]  [facility] Family Doctors  [facility]: [phone], [phone]  Home Blood Pressure Monitoring for Patients   Your provider has recommended that you check your  blood pressure (BP) at least once a week at home. If you do not have a blood pressure cuff at home, one will be provided for you. Contact your provider if you have not received your monitor within 1 week.   Helpful Tips for Accurate Home Blood Pressure Checks  Don't smoke, exercise, or drink caffeine 30 minutes before checking your BP Use the restroom before checking your BP (a full bladder can raise your  pressure) Relax in a comfortable upright chair Feet on the ground Left arm resting comfortably on a flat surface at the level of your heart Legs uncrossed Back supported Sit quietly and don't talk Place the cuff on your bare arm Adjust snuggly, so that only two fingertips can fit between your skin and the top of the cuff Check 2 readings separated by at least one minute Keep a log of your BP readings For a visual, please reference this diagram: http://ccnc.care/bpdiagram  Provider Name: [facility]     Phone: [phone]  Zone 1: ALL CLEAR  Continue to monitor your symptoms:  BP reading is less than 140 (top number) or less than 90 (bottom number)  No right upper stomach pain No headaches or seeing spots No feeling nauseated or throwing up No swelling in face and hands  Zone 2: CAUTION Call your doctor's office for any of the following:  BP reading is greater than 140 (top number) or greater than 90 (bottom number)  Stomach pain under your ribs in the middle or right side Headaches or seeing spots Feeling nauseated or throwing up Swelling in face and hands  Zone 3: EMERGENCY  Seek immediate medical care if you have any of the following:  BP reading is greater than160 (top number) or greater than 110 (bottom number) Severe headaches not improving with Tylenol Serious difficulty catching your breath Any worsening symptoms from Zone 2  Preterm Labor and Birth Information  The normal length of a pregnancy is 39-41 weeks. Preterm labor is when labor starts before 37 completed weeks of pregnancy. What are the risk factors for preterm labor? Preterm labor is more likely to occur in women who: Have certain infections during pregnancy such as a bladder infection, sexually transmitted infection, or infection inside the uterus (chorioamnionitis). Have a shorter-than-normal cervix. Have gone into preterm labor before. Have had surgery on their cervix. Are younger than age 41  or older than age 27. Are African American. Are pregnant with twins or multiple babies (multiple gestation). Take street drugs or smoke while pregnant. Do not gain enough weight while pregnant. Became pregnant shortly after having been pregnant. What are the symptoms of preterm labor? Symptoms of preterm labor include: Cramps similar to those that can happen during a menstrual period. The cramps may happen with diarrhea. Pain in the abdomen or lower back. Regular uterine contractions that may feel like tightening of the abdomen. A feeling of increased pressure in the pelvis. Increased watery or bloody mucus discharge from the vagina. Water breaking (ruptured amniotic sac). Why is it important to recognize signs of preterm labor? It is important to recognize signs of preterm labor because babies who are born prematurely may not be fully developed. This can put them at an increased risk for: Long-term (chronic) heart and lung problems. Difficulty immediately after birth with regulating body systems, including blood sugar, body temperature, heart rate, and breathing rate. Bleeding in the brain. Cerebral palsy. Learning difficulties. Death. These risks are highest for babies who are born before 34 weeks  of pregnancy. How is preterm labor treated? Treatment depends on the length of your pregnancy, your condition, and the health of your baby. It may involve: Having a stitch (suture) placed in your cervix to prevent your cervix from opening too early (cerclage). Taking or being given medicines, such as: Hormone medicines. These may be given early in pregnancy to help support the pregnancy. Medicine to stop contractions. Medicines to help mature the baby's lungs. These may be prescribed if the risk of delivery is high. Medicines to prevent your baby from developing cerebral palsy. If the labor happens before 34 weeks of pregnancy, you may need to stay in the hospital. What should I do if I  think I am in preterm labor? If you think that you are going into preterm labor, call your health care provider right away. How can I prevent preterm labor in future pregnancies? To increase your chance of having a full-term pregnancy: Do not use any tobacco products, such as cigarettes, chewing tobacco, and e-cigarettes. If you need help quitting, ask your health care provider. Do not use street drugs or medicines that have not been prescribed to you during your pregnancy. Talk with your health care provider before taking any herbal supplements, even if you have been taking them regularly. Make sure you gain a healthy amount of weight during your pregnancy. Watch for infection. If you think that you might have an infection, get it checked right away. Make sure to tell your health care provider if you have gone into preterm labor before. This information is not intended to replace advice given to you by your health care provider. Make sure you discuss any questions you have with your health care provider. Document Revised: 05/16/2018 Document Reviewed: 06/15/2015 Elsevier Patient Education  2020 [employer].

## 2022-12-26 NOTE — Progress Notes (Signed)
LOW-RISK PREGNANCY VISIT Patient name: Latasha Thompson MRN [id number]  Date of birth: 05/29/2000 Chief Complaint:   Routine Prenatal Visit  History of Present Illness:   Latasha Thompson is a 22 y.o. G45P1001 female at [redacted]w[redacted]d with an Estimated Date of Delivery: 02/07/23 being seen today for ongoing management of a low-risk pregnancy.   Today she reports occasional contractions. Contractions: Irritability. Vag. Bleeding: None.  Movement: Present. denies leaking of fluid.     11/15/2022    9:22 AM 07/30/2022    3:22 PM 02/16/2022    2:07 PM 12/18/2019    9:03 AM 09/08/2019   11:06 AM  Depression screen PHQ 2/9  Decreased Interest 0 0 0 1 2  Down, Depressed, Hopeless 0 0 0 0 0  PHQ - 2 Score 0 0 0 1 2  Altered sleeping 0 2 1 0 0  Tired, decreased energy 0 2 0 0 1  Change in appetite 0 2 0 0 0  Feeling bad or failure about yourself  0 0 0 0 0  Trouble concentrating 0 0 0 0 0  Moving slowly or fidgety/restless 0 0 0 0 0  Suicidal thoughts 0 0 0 0 0  PHQ-9 Score 0 6 1 1 3   Difficult doing work/chores Not difficult at all  Not difficult at all          07/30/2022    3:23 PM 02/16/2022    2:07 PM 12/18/2019    9:03 AM 09/08/2019   11:06 AM  GAD 7 : Generalized Anxiety Score  Nervous, Anxious, on Edge 0 0 0 1  Control/stop worrying 1 1 0 0  Worry too much - different things 1 1 0 0  Trouble relaxing 1 0 0 0  Restless 0 0 0 0  Easily annoyed or irritable 2 1 0 0  Afraid - awful might happen 0 0 0 0  Total GAD 7 Score 5 3 0 1  Anxiety Difficulty  Not difficult at all        Review of Systems:   Pertinent items are noted in HPI Denies abnormal vaginal discharge w/ itching/odor/irritation, headaches, visual changes, shortness of breath, chest pain, abdominal pain, severe nausea/vomiting, or problems with urination or bowel movements unless otherwise stated above. Pertinent History Reviewed:  Reviewed past medical,surgical, social, obstetrical and family history.  Reviewed problem  list, medications and allergies. Physical Assessment:   Vitals:   12/26/22 1332  BP: 110/66  Pulse: 91  Weight: 170 lb (77.1 kg)  Body mass index is 28.29 kg/m.        Physical Examination:   General appearance: Well appearing, and in no distress  Mental status: Alert, oriented to person, place, and time  Skin: Warm & dry  Cardiovascular: Normal heart rate noted  Respiratory: Normal respiratory effort, no distress  Abdomen: Soft, gravid, nontender  Pelvic: Cervical exam deferred         Extremities: Edema: None  Fetal Status: Fetal Heart Rate (bpm): 160 Fundal Height: 31 cm Movement: Present    Chaperone: N/A   No results found for this or any previous visit (from the past 24 hour(s)).  Assessment & Plan:  1) Low-risk pregnancy G2P1001 at [redacted]w[redacted]d with an Estimated Date of Delivery: 02/07/23    Meds: No orders of the defined types were placed in this encounter.  Labs/procedures today: none  Plan:  Continue routine obstetrical care  Next visit: prefers in person    Reviewed: Preterm labor symptoms and  general obstetric precautions including but not limited to vaginal bleeding, contractions, leaking of fluid and fetal movement were reviewed in detail with the patient.  All questions were answered. Does have home bp cuff. Office bp cuff given: not applicable. Check bp weekly, let us know if consistently >140 and/or >90.  Follow-up: Return for As scheduled.  Future Appointments  Date Time Provider Department Center  01/09/2023  1:50 PM Arabella Merles, CNM [facility]  01/16/2023  1:30 PM Arabella Merles, CNM [facility]  01/23/2023  1:30 PM Cheral Marker, CNM [facility]  01/31/2023  1:30 PM Lazaro Arms, MD [facility]  02/07/2023  1:30 PM Cresenzo-Dishmon, Scarlette Calico, CNM [facility]    No orders of the defined types were placed in this encounter.  Cheral Marker CNM, [facility] 12/26/2022 1:57 PM

## 2023-01-09 ENCOUNTER — Encounter: Payer: Self-pay | Admitting: Advanced Practice Midwife

## 2023-01-09 ENCOUNTER — Ambulatory Visit (INDEPENDENT_AMBULATORY_CARE_PROVIDER_SITE_OTHER): Payer: Medicaid Other | Admitting: Advanced Practice Midwife

## 2023-01-09 VITALS — BP 108/67 | Wt 175.0 lb

## 2023-01-09 DIAGNOSIS — Z3A35 35 weeks gestation of pregnancy: Secondary | ICD-10-CM

## 2023-01-09 DIAGNOSIS — Z3480 Encounter for supervision of other normal pregnancy, unspecified trimester: Secondary | ICD-10-CM

## 2023-01-09 DIAGNOSIS — Z3483 Encounter for supervision of other normal pregnancy, third trimester: Secondary | ICD-10-CM

## 2023-01-09 DIAGNOSIS — O26843 Uterine size-date discrepancy, third trimester: Secondary | ICD-10-CM

## 2023-01-09 NOTE — Progress Notes (Addendum)
   LOW-RISK PREGNANCY VISIT Patient name: Latasha Thompson MRN [id number]  Date of birth: 06/05/2000 Chief Complaint:   Routine Prenatal Visit  History of Present Illness:   Latasha Thompson is a 22 y.o. G17P1001 female at [redacted]w[redacted]d with an Estimated Date of Delivery: 02/07/23 being seen today for ongoing management of a low-risk pregnancy.  Today she reports  having BH ctx last night . Contractions: Irregular.  .  Movement: Present. denies leaking of fluid. Review of Systems:   Pertinent items are noted in HPI Denies abnormal vaginal discharge w/ itching/odor/irritation, headaches, visual changes, shortness of breath, chest pain, abdominal pain, severe nausea/vomiting, or problems with urination or bowel movements unless otherwise stated above. Pertinent History Reviewed:  Reviewed past medical,surgical, social, obstetrical and family history.  Reviewed problem list, medications and allergies. Physical Assessment:   Vitals:   01/09/23 1358  BP: 108/67  Weight: 175 lb (79.4 kg)  Body mass index is 29.12 kg/m.        Physical Examination:   General appearance: Well appearing, and in no distress  Mental status: Alert, oriented to person, place, and time  Skin: Warm & dry  Cardiovascular: Normal heart rate noted  Respiratory: Normal respiratory effort, no distress  Abdomen: Soft, gravid, nontender  Pelvic: Cervical exam deferred         Extremities: Edema: None  Fetal Status: Fetal Heart Rate (bpm): 145 Fundal Height: 30 cm Movement: Present    No results found for this or any previous visit (from the past 24 hour(s)).  Assessment & Plan:  1) Low-risk pregnancy G2P1001 at [redacted]w[redacted]d with an Estimated Date of Delivery: 02/07/23   2) S<D, will get EFW next visit   Meds: No orders of the defined types were placed in this encounter.  Labs/procedures today: none  Plan:  Continue routine obstetrical care; u/s for EFW next visit; GBS/cultures next visit  Reviewed: Preterm labor symptoms and general  obstetric precautions including but not limited to vaginal bleeding, contractions, leaking of fluid and fetal movement were reviewed in detail with the patient.  All questions were answered. Has home bp cuff. Check bp weekly, let us know if >140/90.   Follow-up: Return for As scheduled- add u/s for EFW in to next visit please.  Orders Placed This Encounter  Procedures   US OB Follow Up   Arabella Merles [facility] 01/09/2023 2:25 PM

## 2023-01-16 ENCOUNTER — Ambulatory Visit: Payer: Medicaid Other | Admitting: Radiology

## 2023-01-16 ENCOUNTER — Other Ambulatory Visit (HOSPITAL_COMMUNITY)
Admission: RE | Admit: 2023-01-16 | Discharge: 2023-01-16 | Disposition: A | Discharge status: Home / Self Care | Payer: Medicaid Other | Source: Ambulatory Visit | Attending: Advanced Practice Midwife | Admitting: Advanced Practice Midwife

## 2023-01-16 ENCOUNTER — Encounter: Payer: Self-pay | Admitting: Advanced Practice Midwife

## 2023-01-16 ENCOUNTER — Ambulatory Visit (INDEPENDENT_AMBULATORY_CARE_PROVIDER_SITE_OTHER): Payer: Medicaid Other | Admitting: Advanced Practice Midwife

## 2023-01-16 VITALS — BP 105/63 | HR 88 | Wt 171.0 lb

## 2023-01-16 DIAGNOSIS — Z3483 Encounter for supervision of other normal pregnancy, third trimester: Secondary | ICD-10-CM | POA: Insufficient documentation

## 2023-01-16 DIAGNOSIS — Z3A36 36 weeks gestation of pregnancy: Secondary | ICD-10-CM | POA: Diagnosis not present

## 2023-01-16 DIAGNOSIS — O26843 Uterine size-date discrepancy, third trimester: Secondary | ICD-10-CM | POA: Diagnosis not present

## 2023-01-16 DIAGNOSIS — Z3480 Encounter for supervision of other normal pregnancy, unspecified trimester: Secondary | ICD-10-CM | POA: Diagnosis not present

## 2023-01-16 NOTE — Progress Notes (Signed)
GA = 36+6 weeks Single active female fetus, cephalic  FHR= 152 bpm  anterior pl g2  AFI = 9.6 cm 14% SVP = 5.9 cm  EFW 29.5%  AC 54%

## 2023-01-16 NOTE — Progress Notes (Signed)
   LOW-RISK PREGNANCY VISIT Patient name: Latasha Thompson MRN [id number]  Date of birth: May 26, 2000 Chief Complaint:   Routine Prenatal Visit  History of Present Illness:   Latasha Thompson is a 22 y.o. G54P1001 female at [redacted]w[redacted]d with an Estimated Date of Delivery: 02/07/23 being seen today for ongoing management of a low-risk pregnancy.  Today she reports no complaints. Contractions: Not present. Vag. Bleeding: None.  Movement: Present. denies leaking of fluid. Review of Systems:   Pertinent items are noted in HPI Denies abnormal vaginal discharge w/ itching/odor/irritation, headaches, visual changes, shortness of breath, chest pain, abdominal pain, severe nausea/vomiting, or problems with urination or bowel movements unless otherwise stated above. Pertinent History Reviewed:  Reviewed past medical,surgical, social, obstetrical and family history.  Reviewed problem list, medications and allergies. Physical Assessment:   Vitals:   01/16/23 1136  BP: 105/63  Pulse: 88  Weight: 171 lb (77.6 kg)  Body mass index is 28.46 kg/m.        Physical Examination:   General appearance: Well appearing, and in no distress  Mental status: Alert, oriented to person, place, and time  Skin: Warm & dry  Cardiovascular: Normal heart rate noted  Respiratory: Normal respiratory effort, no distress  Abdomen: Soft, gravid, nontender  Pelvic: Cervical exam deferred         Extremities: Edema: None  Fetal Status: Fetal Heart Rate (bpm): 152 u/s   Movement: Present    Growth u/s: GA = 36+6 weeks Single active female fetus, cephalic  FHR= 152 bpm  anterior pl g2  AFI = 9.6 cm 14% SVP = 5.9 cm  EFW 29.5%  AC 54%  No results found for this or any previous visit (from the past 24 hour(s)).  Assessment & Plan:  1) Low-risk pregnancy G2P1001 at [redacted]w[redacted]d with an Estimated Date of Delivery: 02/07/23   2) Uterine S<D, EFW 30th% today w nl AFI, cephalic   Meds: No orders of the defined types were placed in this  encounter.  Labs/procedures today: growth u/s, GBS/cultures  Plan:  Continue routine obstetrical care   Reviewed: Term labor symptoms and general obstetric precautions including but not limited to vaginal bleeding, contractions, leaking of fluid and fetal movement were reviewed in detail with the patient.  All questions were answered. Has home bp cuff. Check bp weekly, let us know if >140/90.   Follow-up: Return for As scheduled.  Orders Placed This Encounter  Procedures   Culture, beta strep (group b only)   Arabella Merles [facility] 01/16/2023 11:48 AM

## 2023-01-17 LAB — CERVICOVAGINAL ANCILLARY ONLY
Chlamydia: NEGATIVE
Comment: NEGATIVE
Comment: NORMAL
Neisseria Gonorrhea: NEGATIVE

## 2023-01-20 LAB — CULTURE, BETA STREP (GROUP B ONLY): Strep Gp B Culture: NEGATIVE

## 2023-01-23 ENCOUNTER — Ambulatory Visit (INDEPENDENT_AMBULATORY_CARE_PROVIDER_SITE_OTHER): Payer: Medicaid Other | Admitting: Women's Health

## 2023-01-23 ENCOUNTER — Encounter: Payer: Self-pay | Admitting: Women's Health

## 2023-01-23 VITALS — BP 105/59 | HR 91 | Wt 173.0 lb

## 2023-01-23 DIAGNOSIS — Z3483 Encounter for supervision of other normal pregnancy, third trimester: Secondary | ICD-10-CM

## 2023-01-23 DIAGNOSIS — Z3A37 37 weeks gestation of pregnancy: Secondary | ICD-10-CM

## 2023-01-23 DIAGNOSIS — Z3480 Encounter for supervision of other normal pregnancy, unspecified trimester: Secondary | ICD-10-CM

## 2023-01-23 NOTE — Progress Notes (Signed)
LOW-RISK PREGNANCY VISIT Patient name: Latasha Thompson MRN [id number]  Date of birth: [birth year] Chief Complaint:   Routine Prenatal Visit  History of Present Illness:   Latasha Thompson is a 22 y.o. G39P1001 female at [redacted]w[redacted]d with an Estimated Date of Delivery: 02/07/23 being seen today for ongoing management of a low-risk pregnancy.   Today she reports occasional contractions. Contractions: Irritability. Vag. Bleeding: None.  Movement: Present. denies leaking of fluid.     11/15/2022    9:22 AM 07/30/2022    3:22 PM 02/16/2022    2:07 PM 12/18/2019    9:03 AM 09/08/2019   11:06 AM  Depression screen PHQ 2/9  Decreased Interest 0 0 0 1 2  Down, Depressed, Hopeless 0 0 0 0 0  PHQ - 2 Score 0 0 0 1 2  Altered sleeping 0 2 1 0 0  Tired, decreased energy 0 2 0 0 1  Change in appetite 0 2 0 0 0  Feeling bad or failure about yourself  0 0 0 0 0  Trouble concentrating 0 0 0 0 0  Moving slowly or fidgety/restless 0 0 0 0 0  Suicidal thoughts 0 0 0 0 0  PHQ-9 Score 0 6 1 1 3   Difficult doing work/chores Not difficult at all  Not difficult at all          07/30/2022    3:23 PM 02/16/2022    2:07 PM 12/18/2019    9:03 AM 09/08/2019   11:06 AM  GAD 7 : Generalized Anxiety Score  Nervous, Anxious, on Edge 0 0 0 1  Control/stop worrying 1 1 0 0  Worry too much - different things 1 1 0 0  Trouble relaxing 1 0 0 0  Restless 0 0 0 0  Easily annoyed or irritable 2 1 0 0  Afraid - awful might happen 0 0 0 0  Total GAD 7 Score 5 3 0 1  Anxiety Difficulty  Not difficult at all        Review of Systems:   Pertinent items are noted in HPI Denies abnormal vaginal discharge w/ itching/odor/irritation, headaches, visual changes, shortness of breath, chest pain, abdominal pain, severe nausea/vomiting, or problems with urination or bowel movements unless otherwise stated above. Pertinent History Reviewed:  Reviewed past medical,surgical, social, obstetrical and family history.  Reviewed problem  list, medications and allergies. Physical Assessment:   Vitals:   01/23/23 1329  BP: (!) 105/59  Pulse: 91  Weight: 173 lb (78.5 kg)  Body mass index is 28.79 kg/m.        Physical Examination:   General appearance: Well appearing, and in no distress  Mental status: Alert, oriented to person, place, and time  Skin: Warm & dry  Cardiovascular: Normal heart rate noted  Respiratory: Normal respiratory effort, no distress  Abdomen: Soft, gravid, nontender  Pelvic: Cervical exam performed  Dilation: 1.5 Effacement (%): Thick Station: -3  Extremities: Edema: None  Fetal Status: Fetal Heart Rate (bpm): 140 Fundal Height: 34 cm Movement: Present Presentation: Vertex  Chaperone: Malachy Mood   No results found for this or any previous visit (from the past 24 hours).  Assessment & Plan:  1) Low-risk pregnancy G2P1001 at [redacted]w[redacted]d with an Estimated Date of Delivery: 02/07/23   2) S<D, EFW last week 30%   Meds: No orders of the defined types were placed in this encounter.  Labs/procedures today: SVE  Plan:  Continue routine obstetrical care  Next visit: prefers  in person    Reviewed: Term labor symptoms and general obstetric precautions including but not limited to vaginal bleeding, contractions, leaking of fluid and fetal movement were reviewed in detail with the patient.  All questions were answered. Does have home bp cuff. Office bp cuff given: not applicable. Check bp weekly, let us know if consistently >140 and/or >90.  Follow-up: Return for As scheduled.  Future Appointments  Date Time Provider Department Center  01/31/2023  1:30 PM Lazaro Arms, MD [facility]  02/07/2023  1:30 PM Cresenzo-Dishmon, Scarlette Calico, CNM [facility]    No orders of the defined types were placed in this encounter.  Cheral Marker CNM, [facility] 01/23/2023 1:57 PM

## 2023-01-23 NOTE — Patient Instructions (Signed)
Latasha Thompson, thank you for choosing our office today! We appreciate the opportunity to meet your healthcare needs. You may receive a short survey by mail, e-mail, or through [employer]. If you are happy with your care we would appreciate if you could take just a few minutes to complete the survey questions. We read all of your comments and take your feedback very seriously. Thank you again for choosing our office.  Center for [employer] Team at [facility]  [facility] at [facility] ([address] [place], [place] [address]) Entrance C, located off of [address] Free 24/7 valet parking   CLASSES: Go to [email or web address] to register for classes (childbirth, breastfeeding, waterbirth, infant CPR, daddy bootcamp, etc.)  Call the office [phone]) or go to [facility] if: You begin to have strong, frequent contractions Your water breaks.  Sometimes it is a big gush of fluid, sometimes it is just a trickle that keeps getting your panties wet or running down your legs You have vaginal bleeding.  It is normal to have a small amount of spotting if your cervix was checked.  You don't feel your baby moving like normal.  If you don't, get you something to eat and drink and lay down and focus on feeling your baby move.   If your baby is still not moving like normal, you should call the office or go to [facility].  Call the office [phone]) or go to [facility] hospital for these signs of pre-eclampsia: Severe headache that does not go away with Tylenol Visual changes- seeing spots, double, blurred vision Pain under your right breast or upper abdomen that does not go away with Tums or heartburn medicine Nausea and/or vomiting Severe swelling in your hands, feet, and face   [facility] Pediatricians/Family Doctors [facility]): [address] Colette Ribas, [phone]           [facility]: [address], [phone]                 [facility]): [address], [phone] (call to ask if accepting patients) [facility] Department: [address], [place], [phone]    [facility] Pediatricians/Family Doctors [facility]): [address], [phone] [facility]: [address] [place], [phone] [facility]: [address], [phone]  [facility] Doctors  [facility]): [phone] [facility]: [address], [phone]   [facility] Doctors [facility]: [address], [phone]  [facility] Doctors  [facility]: [phone], [phone]  Home Blood Pressure Monitoring for Patients   Your provider has recommended that you check your blood pressure (BP) at least once a week at home. If you do not have a blood pressure cuff at home, one will be provided for you. Contact your provider if you have not received your monitor within 1 week.   Helpful Tips for Accurate Home Blood Pressure Checks  Don't smoke, exercise, or drink caffeine 30 minutes before checking your BP Use the restroom before checking your BP (a full bladder can raise your pressure) Relax in a comfortable upright chair Feet on the ground Left arm resting comfortably on a flat surface at the level of your heart Legs uncrossed Back supported Sit quietly and don't talk Place the cuff on your bare arm Adjust snuggly, so that only two fingertips  can fit between your skin and the top of the cuff Check 2 readings separated by at least one minute Keep a log of your BP readings For a visual, please reference this diagram: http://ccnc.care/bpdiagram  Provider Name: [facility]     Phone: [phone]  Zone 1: ALL CLEAR  Continue to monitor your symptoms:  BP reading is less than 140 (top number) or less than 90 (bottom number)  No right  upper stomach pain No headaches or seeing spots No feeling nauseated or throwing up No swelling in face and hands  Zone 2: CAUTION Call your doctor's office for any of the following:  BP reading is greater than 140 (top number) or greater than 90 (bottom number)  Stomach pain under your ribs in the middle or right side Headaches or seeing spots Feeling nauseated or throwing up Swelling in face and hands  Zone 3: EMERGENCY  Seek immediate medical care if you have any of the following:  BP reading is greater than160 (top number) or greater than 110 (bottom number) Severe headaches not improving with Tylenol Serious difficulty catching your breath Any worsening symptoms from Zone 2   Braxton Hicks Contractions Contractions of the uterus can occur throughout pregnancy, but they are not always a sign that you are in labor. You may have practice contractions called Braxton Hicks contractions. These false labor contractions are sometimes confused with true labor. What are [name] contractions? Braxton Hicks contractions are tightening movements that occur in the muscles of the uterus before labor. Unlike true labor contractions, these contractions do not result in opening (dilation) and thinning of the cervix. Toward the end of pregnancy (32-34 weeks), Braxton Hicks contractions can happen more often and may become stronger. These contractions are sometimes difficult to tell apart from true labor because they can be very uncomfortable. You should not feel embarrassed if you go to the hospital with false labor. Sometimes, the only way to tell if you are in true labor is for your health care provider to look for changes in the cervix. The health care provider will do a physical exam and may monitor your contractions. If you are not in true labor, the exam should show that your cervix is not dilating and your water has not broken. If there are no other health problems associated with your  pregnancy, it is completely safe for you to be sent home with false labor. You may continue to have Braxton Hicks contractions until you go into true labor. How to tell the difference between true labor and false labor True labor Contractions last 30-70 seconds. Contractions become very regular. Discomfort is usually felt in the top of the uterus, and it spreads to the lower abdomen and low back. Contractions do not go away with walking. Contractions usually become more intense and increase in frequency. The cervix dilates and gets thinner. False labor Contractions are usually shorter and not as strong as true labor contractions. Contractions are usually irregular. Contractions are often felt in the front of the lower abdomen and in the groin. Contractions may go away when you walk around or change positions while lying down. Contractions get weaker and are shorter-lasting as time goes on. The cervix usually does not dilate or become thin. Follow these instructions at home:  Take over-the-counter and prescription medicines only as told by your health care provider. Keep up with your usual exercises and follow other instructions from your health care provider. Eat and drink lightly if you think  you are going into labor. If Braxton Hicks contractions are making you uncomfortable: Change your position from lying down or resting to walking, or change from walking to resting. Sit and rest in a tub of warm water. Drink enough fluid to keep your urine pale yellow. Dehydration may cause these contractions. Do slow and deep breathing several times an hour. Keep all follow-up prenatal visits as told by your health care provider. This is important. Contact a health care provider if: You have a fever. You have continuous pain in your abdomen. Get help right away if: Your contractions become stronger, more regular, and closer together. You have fluid leaking or gushing from your vagina. You pass  blood-tinged mucus (bloody show). You have bleeding from your vagina. You have low back pain that you never had before. You feel your baby's head pushing down and causing pelvic pressure. Your baby is not moving inside you as much as it used to. Summary Contractions that occur before labor are called Braxton Hicks contractions, false labor, or practice contractions. Braxton Hicks contractions are usually shorter, weaker, farther apart, and less regular than true labor contractions. True labor contractions usually become progressively stronger and regular, and they become more frequent. Manage discomfort from [facility] contractions by changing position, resting in a warm bath, drinking plenty of water, or practicing deep breathing. This information is not intended to replace advice given to you by your health care provider. Make sure you discuss any questions you have with your health care provider. Document Revised: 01/04/2017 Document Reviewed: 06/07/2016 Elsevier Patient Education  2020 [employer].

## 2023-01-25 ENCOUNTER — Encounter: Payer: Self-pay | Admitting: Women's Health

## 2023-01-31 ENCOUNTER — Ambulatory Visit: Payer: Medicaid Other | Admitting: Obstetrics & Gynecology

## 2023-01-31 ENCOUNTER — Encounter: Payer: Self-pay | Admitting: Obstetrics & Gynecology

## 2023-01-31 VITALS — BP 94/62 | HR 86 | Wt 173.0 lb

## 2023-01-31 DIAGNOSIS — Z3480 Encounter for supervision of other normal pregnancy, unspecified trimester: Secondary | ICD-10-CM

## 2023-01-31 DIAGNOSIS — Z3483 Encounter for supervision of other normal pregnancy, third trimester: Secondary | ICD-10-CM

## 2023-01-31 DIAGNOSIS — Z3A39 39 weeks gestation of pregnancy: Secondary | ICD-10-CM

## 2023-01-31 NOTE — Progress Notes (Signed)
   LOW-RISK PREGNANCY VISIT Patient name: Latasha Thompson MRN [id number]  Date of birth: 01-May-2000 Chief Complaint:   Routine Prenatal Visit  History of Present Illness:   Latasha Thompson is a 22 y.o. G52P1001 female at [redacted]w[redacted]d with an Estimated Date of Delivery: 02/07/23 being seen today for ongoing management of a low-risk pregnancy.     11/15/2022    9:22 AM 07/30/2022    3:22 PM 02/16/2022    2:07 PM 12/18/2019    9:03 AM 09/08/2019   11:06 AM  Depression screen PHQ 2/9  Decreased Interest 0 0 0 1 2  Down, Depressed, Hopeless 0 0 0 0 0  PHQ - 2 Score 0 0 0 1 2  Altered sleeping 0 2 1 0 0  Tired, decreased energy 0 2 0 0 1  Change in appetite 0 2 0 0 0  Feeling bad or failure about yourself  0 0 0 0 0  Trouble concentrating 0 0 0 0 0  Moving slowly or fidgety/restless 0 0 0 0 0  Suicidal thoughts 0 0 0 0 0  PHQ-9 Score 0 6 1 1 3   Difficult doing work/chores Not difficult at all  Not difficult at all      Today she reports no complaints. Contractions: Irritability. Vag. Bleeding: None.  Movement: Present. denies leaking of fluid. Review of Systems:   Pertinent items are noted in HPI Denies abnormal vaginal discharge w/ itching/odor/irritation, headaches, visual changes, shortness of breath, chest pain, abdominal pain, severe nausea/vomiting, or problems with urination or bowel movements unless otherwise stated above. Pertinent History Reviewed:  Reviewed past medical,surgical, social, obstetrical and family history.  Reviewed problem list, medications and allergies. Physical Assessment:   Vitals:   01/31/23 1349  BP: 94/62  Pulse: 86  Weight: 173 lb (78.5 kg)  Body mass index is 28.79 kg/m.        Physical Examination:   General appearance: Well appearing, and in no distress  Mental status: Alert, oriented to person, place, and time  Skin: Warm & dry  Cardiovascular: Normal heart rate noted  Respiratory: Normal respiratory effort, no distress  Abdomen: Soft, gravid,  nontender  Pelvic: Cervical exam deferred         Extremities: Edema: None  Fetal Status:     Movement: Present    Chaperone: n/a    No results found for this or any previous visit (from the past 24 hours).  Assessment & Plan:  1) Low-risk pregnancy G2P1001 at [redacted]w[redacted]d with an Estimated Date of Delivery: 02/07/23      Meds: No orders of the defined types were placed in this encounter.  Labs/procedures today:   Plan:  Continue routine obstetrical care  Next visit: prefers in person      Follow-up: No follow-ups on file.  No orders of the defined types were placed in this encounter.   Lazaro Arms, MD 01/31/2023 1:57 PM

## 2023-02-03 ENCOUNTER — Encounter (HOSPITAL_COMMUNITY): Payer: Self-pay | Admitting: Obstetrics & Gynecology

## 2023-02-03 ENCOUNTER — Inpatient Hospital Stay (HOSPITAL_COMMUNITY)
Admission: AD | Admit: 2023-02-03 | Discharge: 2023-02-04 | DRG: 798 | Disposition: A | Discharge status: Home / Self Care | Payer: Medicaid Other | Attending: Obstetrics & Gynecology | Admitting: Obstetrics & Gynecology

## 2023-02-03 ENCOUNTER — Other Ambulatory Visit: Payer: Self-pay

## 2023-02-03 ENCOUNTER — Inpatient Hospital Stay (HOSPITAL_COMMUNITY): Payer: Medicaid Other | Admitting: Anesthesiology

## 2023-02-03 DIAGNOSIS — O26893 Other specified pregnancy related conditions, third trimester: Secondary | ICD-10-CM | POA: Diagnosis not present

## 2023-02-03 DIAGNOSIS — O4202 Full-term premature rupture of membranes, onset of labor within 24 hours of rupture: Secondary | ICD-10-CM | POA: Diagnosis not present

## 2023-02-03 DIAGNOSIS — Z3A39 39 weeks gestation of pregnancy: Secondary | ICD-10-CM | POA: Diagnosis not present

## 2023-02-03 DIAGNOSIS — O4292 Full-term premature rupture of membranes, unspecified as to length of time between rupture and onset of labor: Principal | ICD-10-CM | POA: Diagnosis present

## 2023-02-03 LAB — CBC
HCT: 33 % — ABNORMAL LOW (ref 36.0–46.0)
Hemoglobin: 10.7 g/dL — ABNORMAL LOW (ref 12.0–15.0)
MCH: 29 pg (ref 26.0–34.0)
MCHC: 32.4 g/dL (ref 30.0–36.0)
MCV: 89.4 fL (ref 80.0–100.0)
Platelets: 218 10*3/uL (ref 150–400)
RBC: 3.69 MIL/uL — ABNORMAL LOW (ref 3.87–5.11)
RDW: 13.4 % (ref 11.5–15.5)
WBC: 10.2 10*3/uL (ref 4.0–10.5)
nRBC: 0 % (ref 0.0–0.2)

## 2023-02-03 LAB — POCT FERN TEST: POCT Fern Test: POSITIVE

## 2023-02-03 LAB — TYPE AND SCREEN
ABO/RH(D): A POS
Antibody Screen: NEGATIVE

## 2023-02-03 LAB — WET PREP, GENITAL
Clue Cells Wet Prep HPF POC: NONE SEEN
Sperm: NONE SEEN
Trich, Wet Prep: NONE SEEN
WBC, Wet Prep HPF POC: >=10 — AB (ref <10)

## 2023-02-03 LAB — RPR: RPR Ser Ql: NONREACTIVE

## 2023-02-03 MED ORDER — LIDOCAINE-EPINEPHRINE (PF) 2 %-1:200000 IJ SOLN
INTRAMUSCULAR | Status: DC | PRN
  Administered 2023-02-03: 3 mL via EPIDURAL

## 2023-02-03 MED ORDER — OXYCODONE-ACETAMINOPHEN 5-325 MG PO TABS: 2.0000 | ORAL_TABLET | ORAL | Status: DC | PRN

## 2023-02-03 MED ORDER — MAGNESIUM HYDROXIDE 400 MG/5ML PO SUSP: 30.0000 mL | ORAL | Status: DC | PRN

## 2023-02-03 MED ORDER — ONDANSETRON HCL 4 MG PO TABS
4.0000 mg | ORAL_TABLET | ORAL | Status: DC | PRN
Start: 2023-02-03 — End: 2023-02-04

## 2023-02-03 MED ORDER — PHENYLEPHRINE 80 MCG/ML (10ML) SYRINGE FOR IV PUSH (FOR BLOOD PRESSURE SUPPORT): 80.0000 ug | PREFILLED_SYRINGE | INTRAVENOUS | Status: DC | PRN

## 2023-02-03 MED ORDER — LIDOCAINE HCL (PF) 1 % IJ SOLN: 30.0000 mL | INTRAMUSCULAR | Status: DC | PRN

## 2023-02-03 MED ORDER — FENTANYL-BUPIVACAINE-NACL 0.5-0.125-0.9 MG/250ML-% EP SOLN
12.0000 mL/h | EPIDURAL | Status: DC | PRN
  Administered 2023-02-03: 12 mL/h via EPIDURAL
  Filled 2023-02-03: qty 250

## 2023-02-03 MED ORDER — FENTANYL CITRATE (PF) 100 MCG/2ML IJ SOLN: 50.0000 ug | INTRAMUSCULAR | Status: DC | PRN

## 2023-02-03 MED ORDER — COCONUT OIL OIL: 1.0000 | TOPICAL_OIL | Status: DC | PRN

## 2023-02-03 MED ORDER — IBUPROFEN 600 MG PO TABS
600.0000 mg | ORAL_TABLET | Freq: Four times a day (QID) | ORAL | Status: DC
  Administered 2023-02-03 – 2023-02-04 (×4): 600 mg via ORAL
  Filled 2023-02-03 (×4): qty 1

## 2023-02-03 MED ORDER — TETANUS-DIPHTH-ACELL PERTUSSIS 5-2.5-18.5 LF-MCG/0.5 IM SUSY: 0.5000 mL | PREFILLED_SYRINGE | Freq: Once | INTRAMUSCULAR | Status: DC

## 2023-02-03 MED ORDER — OXYCODONE-ACETAMINOPHEN 5-325 MG PO TABS: 1.0000 | ORAL_TABLET | ORAL | Status: DC | PRN

## 2023-02-03 MED ORDER — BENZOCAINE-MENTHOL 20-0.5 % EX AERO
1.0000 | INHALATION_SPRAY | CUTANEOUS | Status: DC | PRN
  Administered 2023-02-03: 1 via TOPICAL
  Filled 2023-02-03: qty 56

## 2023-02-03 MED ORDER — OXYTOCIN BOLUS FROM INFUSION
333.0000 mL | Freq: Once | INTRAVENOUS | Status: AC
  Administered 2023-02-03: 333 mL via INTRAVENOUS

## 2023-02-03 MED ORDER — DIBUCAINE (PERIANAL) 1 % EX OINT: 1.0000 | TOPICAL_OINTMENT | CUTANEOUS | Status: DC | PRN

## 2023-02-03 MED ORDER — BUPIVACAINE HCL (PF) 0.25 % IJ SOLN
INTRAMUSCULAR | Status: DC | PRN
  Administered 2023-02-03 (×2): 4 mL via EPIDURAL

## 2023-02-03 MED ORDER — ACETAMINOPHEN 325 MG PO TABS
650.0000 mg | ORAL_TABLET | ORAL | Status: DC | PRN
  Administered 2023-02-04: 650 mg via ORAL
  Filled 2023-02-03: qty 2

## 2023-02-03 MED ORDER — DIPHENHYDRAMINE HCL 50 MG/ML IJ SOLN: 12.5000 mg | INTRAMUSCULAR | Status: DC | PRN

## 2023-02-03 MED ORDER — SIMETHICONE 80 MG PO CHEW: 80.0000 mg | CHEWABLE_TABLET | ORAL | Status: DC | PRN

## 2023-02-03 MED ORDER — METHYLERGONOVINE MALEATE 0.2 MG PO TABS
0.2000 mg | ORAL_TABLET | ORAL | Status: DC
  Administered 2023-02-03 – 2023-02-04 (×5): 0.2 mg via ORAL
  Filled 2023-02-03 (×5): qty 1

## 2023-02-03 MED ORDER — MEDROXYPROGESTERONE ACETATE 150 MG/ML IM SUSP: 150.0000 mg | INTRAMUSCULAR | Status: DC | PRN

## 2023-02-03 MED ORDER — EPHEDRINE 5 MG/ML INJ: 10.0000 mg | INTRAVENOUS | Status: DC | PRN

## 2023-02-03 MED ORDER — DIPHENHYDRAMINE HCL 25 MG PO CAPS: 25.0000 mg | ORAL_CAPSULE | Freq: Four times a day (QID) | ORAL | Status: DC | PRN

## 2023-02-03 MED ORDER — FENTANYL CITRATE (PF) 100 MCG/2ML IJ SOLN: 100.0000 ug | INTRAMUSCULAR | Status: DC | PRN

## 2023-02-03 MED ORDER — OXYCODONE-ACETAMINOPHEN 5-325 MG PO TABS
1.0000 | ORAL_TABLET | ORAL | Status: DC | PRN
Start: 2023-02-03 — End: 2023-02-04

## 2023-02-03 MED ORDER — OXYTOCIN-SODIUM CHLORIDE 30-0.9 UT/500ML-% IV SOLN
2.5000 [IU]/h | INTRAVENOUS | Status: DC
  Administered 2023-02-03: 2.5 [IU]/h via INTRAVENOUS
  Filled 2023-02-03: qty 500

## 2023-02-03 MED ORDER — PRENATAL MULTIVITAMIN CH
1.0000 | ORAL_TABLET | Freq: Every day | ORAL | Status: DC
  Administered 2023-02-04: 1 via ORAL
  Filled 2023-02-03: qty 1

## 2023-02-03 MED ORDER — SOD CITRATE-CITRIC ACID 500-334 MG/5ML PO SOLN: 30.0000 mL | ORAL | Status: DC | PRN

## 2023-02-03 MED ORDER — WITCH HAZEL-GLYCERIN EX PADS: 1.0000 | MEDICATED_PAD | CUTANEOUS | Status: DC | PRN

## 2023-02-03 MED ORDER — LACTATED RINGERS IV SOLN: INTRAVENOUS | Status: DC

## 2023-02-03 MED ORDER — MEASLES, MUMPS & RUBELLA VAC IJ SOLR: 0.5000 mL | Freq: Once | INTRAMUSCULAR | Status: DC

## 2023-02-03 MED ORDER — LACTATED RINGERS IV SOLN: 500.0000 mL | Freq: Once | INTRAVENOUS | Status: DC

## 2023-02-03 MED ORDER — HYDROXYZINE HCL 50 MG PO TABS
50.0000 mg | ORAL_TABLET | Freq: Four times a day (QID) | ORAL | Status: DC | PRN
Start: 2023-02-03 — End: 2023-02-03

## 2023-02-03 MED ORDER — ONDANSETRON HCL 4 MG/2ML IJ SOLN
4.0000 mg | Freq: Four times a day (QID) | INTRAMUSCULAR | Status: DC | PRN
Start: 2023-02-03 — End: 2023-02-03

## 2023-02-03 MED ORDER — ACETAMINOPHEN 325 MG PO TABS
650.0000 mg | ORAL_TABLET | ORAL | Status: DC | PRN
Start: 2023-02-03 — End: 2023-02-03

## 2023-02-03 MED ORDER — ONDANSETRON HCL 4 MG/2ML IJ SOLN: 4.0000 mg | INTRAMUSCULAR | Status: DC | PRN

## 2023-02-03 MED ORDER — LACTATED RINGERS IV SOLN: 500.0000 mL | INTRAVENOUS | Status: DC | PRN

## 2023-02-03 NOTE — H&P (Cosign Needed)
History and Physical   [name] is a 22 y.o. [phone] pregnant/non-pregnant female at [redacted]w[redacted]d who presents to MAU today with complaint of contractions since 0430. Reports possible LOF at 0300.   Receives care at [facility]. Prenatal records reviewed.  Pertinent items noted in HPI and remainder of comprehensive ROS otherwise negative.   NURSING  PROVIDER  Office Location [facility] Dating by U/S at 5 wks  [facility] Model Traditional Anatomy U/S Normal female 'Latasha Thompson'  Initiated care at  [facility] [employer]  English              LAB RESULTS   Support Person  Genetics NIPS: low risk girl AFP:     NT/IT (FT only) neg    Carrier Screen Horizon: neg  Rhogam  A/Positive/-- (06/24 1625)N/a A1C/GTT Early:  Third trimester: normal  Flu Vaccine declined    TDaP Vaccine  11/15/22 Blood Type A/Positive/-- (06/24 1625)  Covid Vaccine  Antibody Negative (10/10 0845)    Rubella 7.21 (06/24 1625)  Feeding Plan breast RPR Non Reactive (10/10 0845)  Contraception Depo at pp visit HBsAg Negative (06/24 1625)  Circumcision N/a HIV Non Reactive (10/10 0845)  Pediatrician  [facility] HCVAb Non Reactive (06/24 1625)  Prenatal Classes discussed      Pap 09/19/22 NILM  BTLConsent N/a GC/CT Initial:  -/- 36wks:  -/-  VBAC  Consent N/a GBS  NEG For PCN allergy, check sensitivities        DME Rx [ x] BP cuff [ ]  Weight Scale Waterbirth  [ ]  Class [ ]  Consent [ ]  CNM visit  PHQ9 & GAD7 [ x ] new OB [  ] 28 weeks  [  ] 36 weeks Induction  [ ]  Orders Entered [ ] Foley Y/N     O BP 120/69 (BP Location: Left Arm)   Pulse 64   Temp 98.7 F (37.1 C) (Oral)   Resp 18   Ht 5\' 3"  (1.6 m)   Wt 79.7 kg   LMP 04/06/2022 (Approximate)   SpO2 99%   Breastfeeding Unknown   BMI 31.12 kg/m  Physical Exam Vitals reviewed.  Constitutional:      General: She is not in acute distress.    Appearance: Normal appearance. She is not ill-appearing, toxic-appearing or diaphoretic.  HENT:     Head:  Normocephalic.  Cardiovascular:     Rate and Rhythm: Normal rate.     Pulses: Normal pulses.  Pulmonary:     Effort: Pulmonary effort is normal.  Skin:    General: Skin is warm and dry.     Capillary Refill: Capillary refill takes less than 2 seconds.  Neurological:     Mental Status: She is alert and oriented to person, place, and time.  Psychiatric:        Mood and Affect: Mood normal.        Behavior: Behavior normal.        Thought Content: Thought content normal.        Judgment: Judgment normal.    Results for orders placed or performed during the hospital encounter of 02/03/23 (from the past 24 hours)  Fern Test     Status: Abnormal   Collection Time: 02/03/23  7:05 AM  Result Value Ref Range   POCT Fern Test Positive = ruptured amniotic membanes   Wet prep, genital     Status: Abnormal  Collection Time: 02/03/23  7:22 AM  Result Value Ref Range   Yeast Wet Prep HPF POC PRESENT (A) NONE SEEN   Trich, Wet Prep NONE SEEN NONE SEEN   Clue Cells Wet Prep HPF POC NONE SEEN NONE SEEN   WBC, Wet Prep HPF POC >=10 (A) <10   Sperm NONE SEEN   Type and screen [facility]     Status: None   Collection Time: 02/03/23  7:45 AM  Result Value Ref Range   ABO/RH(D) A POS    Antibody Screen NEG    Sample Expiration      02/06/2023,2359 Performed at [facility], [address], [place], [place] [address]   CBC     Status: Abnormal   Collection Time: 02/03/23  7:46 AM  Result Value Ref Range   WBC 10.2 4.0 - 10.5 K/uL   RBC 3.69 (L) 3.87 - 5.11 MIL/uL   Hemoglobin 10.7 (L) 12.0 - 15.0 g/dL   HCT 62.1 (L) 30.8 - 65.7 %   MCV 89.4 80.0 - 100.0 fL   MCH 29.0 26.0 - 34.0 pg   MCHC 32.4 30.0 - 36.0 g/dL   RDW 84.6 96.2 - 95.2 %   Platelets 218 150 - 400 K/uL   nRBC 0.0 0.0 - 0.2 %  RPR     Status: None   Collection Time: 02/03/23  7:46 AM  Result Value Ref Range   RPR Ser Ql NON REACTIVE NON REACTIVE    Prenatal Transfer Tool  Maternal Diabetes:  No Genetic Screening: Normal Maternal Ultrasounds/Referrals: Normal Fetal Ultrasounds or other Referrals:  None Maternal Substance Abuse:  No Significant Maternal Medications:  None Significant Maternal Lab Results: Group B Strep negative Number of Prenatal Visits:greater than 3 verified prenatal visits Maternal Vaccinations:TDap Other Comments:  None   Assessment: 1. Labor: SROM/early, contracting spontaneously  2. Fetal Wellbeing: Category I  3. Pain Control: Requests epidural 4. GBS: Neg 5. 39.3 week IUP  Plan:  1. Admit to BS per consult with MD 2. Routine L&D orders 3. Analgesia/anesthesia PRN  4. Expectant management   Latasha Thompson [place] 02/03/2023 9:28 PM

## 2023-02-03 NOTE — Anesthesia Preprocedure Evaluation (Signed)
Anesthesia Evaluation  Patient identified by MRN, date of birth, ID band Patient awake    Reviewed: Allergy & Precautions, Patient's Chart, lab work & pertinent test results  History of Anesthesia Complications Negative for: history of anesthetic complications  Airway Mallampati: II  TM Distance: >3 FB     Dental  (+) Dental Advisory Given   Pulmonary neg pulmonary ROS   breath sounds clear to auscultation       Cardiovascular negative cardio ROS  Rhythm:Regular     Neuro/Psych negative neurological ROS  negative psych ROS   GI/Hepatic negative GI ROS, Neg liver ROS,,,  Endo/Other  negative endocrine ROS    Renal/GU negative Renal ROS     Musculoskeletal   Abdominal   Peds  Hematology negative hematology ROS (+) Lab Results      Component                Value               Date                      WBC                      10.2                02/03/2023                HGB                      10.7 (L)            02/03/2023                HCT                      33.0 (L)            02/03/2023                MCV                      89.4                02/03/2023                PLT                      218                 02/03/2023              Anesthesia Other Findings   Reproductive/Obstetrics (+) Pregnancy                             Anesthesia Physical Anesthesia Plan  ASA: 2  Anesthesia Plan: Epidural   Post-op Pain Management:    Induction:   PONV Risk Score and Plan: 2 and Treatment may vary due to age or medical condition  Airway Management Planned: Natural Airway  Additional Equipment: None  Intra-op Plan:   Post-operative Plan:   Informed Consent: I have reviewed the patients History and Physical, chart, labs and discussed the procedure including the risks, benefits and alternatives for the proposed anesthesia with the patient or authorized representative who  has indicated his/her understanding and acceptance.       Plan Discussed with:  Anesthesiologist  Anesthesia Plan Comments:        Anesthesia Quick Evaluation

## 2023-02-03 NOTE — MAU Note (Addendum)
.  Latasha Thompson is a 22 y.o. at [redacted]w[redacted]d here in MAU reporting: ctxs since 0430   Contractions every: 4-5 minutes Onset of ctx: 0430 Pain score: 7-8  ROM: possible- states she feels like she is peeing every minute but has control of it. Feels like she started "peeing" more than her usual around 0300. Vaginal Bleeding: denies  Last SVE: 1 week ago 1cm/vertex Labor Pain Management Plan: Planning epidural  Fetal Movement: Reports positive FM FHT:    Vitals:   02/03/23 0711  BP: 123/64  Pulse: 92  Resp: 18  Temp: 98.3 F (36.8 C)     OB Office: Faculty GBS: Negative Lab orders placed from triage: MAU Labor Eval and Fern swab

## 2023-02-03 NOTE — MAU Provider Note (Signed)
None     S [name] is a 22 y.o. [id number] pregnant/non-pregnant female at [redacted]w[redacted]d who presents to MAU today with complaint of contractions since 0430. Reports possible LOF at 0300.   Receives care at [facility]. Prenatal records reviewed.  Pertinent items noted in HPI and remainder of comprehensive ROS otherwise negative.   O BP 123/64   Pulse 92   Temp 98.3 F (36.8 C) (Oral)   Resp 18   Ht 5\' 3"  (1.6 m)   Wt 79.7 kg   LMP 04/06/2022 (Approximate)   BMI 31.12 kg/m  Physical Exam Vitals reviewed.  Constitutional:      General: She is not in acute distress.    Appearance: Normal appearance. She is not ill-appearing, toxic-appearing or diaphoretic.  HENT:     Head: Normocephalic.  Cardiovascular:     Rate and Rhythm: Normal rate.     Pulses: Normal pulses.  Pulmonary:     Effort: Pulmonary effort is normal.  Skin:    General: Skin is warm and dry.     Capillary Refill: Capillary refill takes less than 2 seconds.  Neurological:     Mental Status: She is alert and oriented to person, place, and time.  Psychiatric:        Mood and Affect: Mood normal.        Behavior: Behavior normal.        Thought Content: Thought content normal.        Judgment: Judgment normal.    No results found for this or any previous visit (from the past 24 hours).  MDM: SVE performed Fern positive   MAU Course:  A SROM at 0300 12/29 clear fluid Medical screening exam complete  P Admit to L&D Handoff of care to L&D team  Future Appointments  Date Time Provider Department Center  02/07/2023  1:30 PM Jacklyn Shell, CNM [facility]     Richardson Landry, CNM 02/03/2023 7:22 AM

## 2023-02-03 NOTE — MAU Provider Note (Incomplete)
History and Physical   S [name] is a 22 y.o. [phone] pregnant/non-pregnant female at [redacted]w[redacted]d who presents to MAU today with complaint of contractions since 0430. Reports possible LOF at 0300.   Receives care at [facility]. Prenatal records reviewed.  Pertinent items noted in HPI and remainder of comprehensive ROS otherwise negative.   NURSING  PROVIDER  Office Location [facility] Dating by U/S at 5 wks  [facility] Model Traditional Anatomy U/S Normal female 'Latasha Thompson'  Initiated care at  [facility] [facility] [employer]  English              LAB RESULTS   Support Person  Genetics NIPS: low risk girl AFP:     NT/IT (FT only) neg    Carrier Screen Horizon: neg  Rhogam  A/Positive/-- (06/24 1625)N/a A1C/GTT Early:  Third trimester: normal  Flu Vaccine declined    TDaP Vaccine  11/15/22 Blood Type A/Positive/-- (06/24 1625)  Covid Vaccine  Antibody Negative (10/10 0845)    Rubella 7.21 (06/24 1625)  Feeding Plan breast RPR Non Reactive (10/10 0845)  Contraception Depo at pp visit HBsAg Negative (06/24 1625)  Circumcision N/a HIV Non Reactive (10/10 0845)  Pediatrician  [facility] HCVAb Non Reactive (06/24 1625)  Prenatal Classes discussed      Pap 09/19/22 NILM  BTLConsent N/a GC/CT Initial:  -/- 36wks:  -/-  VBAC  Consent N/a GBS  NEG For PCN allergy, check sensitivities        DME Rx [ x] BP cuff [ ]  Weight Scale Waterbirth  [ ]  Class [ ]  Consent [ ]  CNM visit  PHQ9 & GAD7 [ x ] new OB [  ] 28 weeks  [  ] 36 weeks Induction  [ ]  Orders Entered [ ] Foley Y/N     O BP 120/69 (BP Location: Left Arm)   Pulse 64   Temp 98.7 F (37.1 C) (Oral)   Resp 18   Ht 5\' 3"  (1.6 m)   Wt 79.7 kg   LMP 04/06/2022 (Approximate)   SpO2 99%   Breastfeeding Unknown   BMI 31.12 kg/m  Physical Exam Vitals reviewed.  Constitutional:      General: She is not in acute distress.    Appearance: Normal appearance. She is not ill-appearing, toxic-appearing or diaphoretic.  HENT:     Head:  Normocephalic.  Cardiovascular:     Rate and Rhythm: Normal rate.     Pulses: Normal pulses.  Pulmonary:     Effort: Pulmonary effort is normal.  Skin:    General: Skin is warm and dry.     Capillary Refill: Capillary refill takes less than 2 seconds.  Neurological:     Mental Status: She is alert and oriented to person, place, and time.  Psychiatric:        Mood and Affect: Mood normal.        Behavior: Behavior normal.        Thought Content: Thought content normal.        Judgment: Judgment normal.    Results for orders placed or performed during the hospital encounter of 02/03/23 (from the past 24 hours)  Fern Test     Status: Abnormal   Collection Time: 02/03/23  7:05 AM  Result Value Ref Range   POCT Fern Test Positive = ruptured amniotic membanes   Wet prep, genital     Status: Abnormal  Collection Time: 02/03/23  7:22 AM  Result Value Ref Range   Yeast Wet Prep HPF POC PRESENT (A) NONE SEEN   Trich, Wet Prep NONE SEEN NONE SEEN   Clue Cells Wet Prep HPF POC NONE SEEN NONE SEEN   WBC, Wet Prep HPF POC >=10 (A) <10   Sperm NONE SEEN   Type and screen [facility]     Status: None   Collection Time: 02/03/23  7:45 AM  Result Value Ref Range   ABO/RH(D) A POS    Antibody Screen NEG    Sample Expiration      02/06/2023,2359 Performed at [facility], [address], [place], [place] [address]   CBC     Status: Abnormal   Collection Time: 02/03/23  7:46 AM  Result Value Ref Range   WBC 10.2 4.0 - 10.5 K/uL   RBC 3.69 (L) 3.87 - 5.11 MIL/uL   Hemoglobin 10.7 (L) 12.0 - 15.0 g/dL   HCT 62.1 (L) 30.8 - 65.7 %   MCV 89.4 80.0 - 100.0 fL   MCH 29.0 26.0 - 34.0 pg   MCHC 32.4 30.0 - 36.0 g/dL   RDW 84.6 96.2 - 95.2 %   Platelets 218 150 - 400 K/uL   nRBC 0.0 0.0 - 0.2 %  RPR     Status: None   Collection Time: 02/03/23  7:46 AM  Result Value Ref Range   RPR Ser Ql NON REACTIVE NON REACTIVE    Prenatal Transfer Tool  Maternal Diabetes:  No Genetic Screening: Normal Maternal Ultrasounds/Referrals: Normal Fetal Ultrasounds or other Referrals:  None Maternal Substance Abuse:  No Significant Maternal Medications:  None Significant Maternal Lab Results: Group B Strep negative Number of Prenatal Visits:greater than 3 verified prenatal visits Maternal Vaccinations:TDap Other Comments:  None   Assessment: 1. Labor: SROM/early, contracting spontaneously  2. Fetal Wellbeing: Category I  3. Pain Control: Requests epidural 4. GBS: Neg 5. 39.3 week IUP  Plan:  1. Admit to BS per consult with MD 2. Routine L&D orders 3. Analgesia/anesthesia PRN  4. Expectant management   Katrinka Blazing [place] 02/03/2023 9:28 PM

## 2023-02-03 NOTE — Anesthesia Procedure Notes (Signed)
Epidural Patient location during procedure: OB Start time: 02/03/2023 9:28 AM End time: 02/03/2023 9:48 AM  Staffing Anesthesiologist: Val Eagle, MD Performed: anesthesiologist   Preanesthetic Checklist Completed: patient identified, IV checked, risks and benefits discussed, monitors and equipment checked, pre-op evaluation and timeout performed  Epidural Patient position: sitting Prep: DuraPrep Patient monitoring: heart rate, continuous pulse ox and blood pressure Approach: midline Location: L4-L5 Injection technique: LOR saline  Needle:  Needle type: Tuohy  Needle gauge: 17 G Needle length: 9 cm Needle insertion depth: 7 cm Catheter type: closed end flexible Catheter size: 19 Gauge Catheter at skin depth: 13 cm Test dose: negative and 2% lidocaine with Epi 1:200 K  Assessment Events: blood not aspirated, no cerebrospinal fluid, injection not painful, no injection resistance, no paresthesia and negative IV test

## 2023-02-04 LAB — GC/CHLAMYDIA PROBE AMP (~~LOC~~) NOT AT ARMC
Chlamydia: NEGATIVE
Comment: NEGATIVE
Comment: NORMAL
Neisseria Gonorrhea: NEGATIVE

## 2023-02-04 LAB — CBC
HCT: 33.4 % — ABNORMAL LOW (ref 36.0–46.0)
Hemoglobin: 10.6 g/dL — ABNORMAL LOW (ref 12.0–15.0)
MCH: 28.8 pg (ref 26.0–34.0)
MCHC: 31.7 g/dL (ref 30.0–36.0)
MCV: 90.8 fL (ref 80.0–100.0)
Platelets: 182 10*3/uL (ref 150–400)
RBC: 3.68 MIL/uL — ABNORMAL LOW (ref 3.87–5.11)
RDW: 13.7 % (ref 11.5–15.5)
WBC: 12.1 10*3/uL — ABNORMAL HIGH (ref 4.0–10.5)
nRBC: 0 % (ref 0.0–0.2)

## 2023-02-04 MED ORDER — IBUPROFEN 600 MG PO TABS: 600.0000 mg | ORAL_TABLET | Freq: Four times a day (QID) | ORAL | 0 refills | Status: DC

## 2023-02-04 MED ORDER — SIMETHICONE 80 MG PO CHEW
80.0000 mg | CHEWABLE_TABLET | Freq: Four times a day (QID) | ORAL | Status: DC
Start: 2023-02-04 — End: 2023-02-04
  Administered 2023-02-04: 80 mg via ORAL
  Filled 2023-02-04: qty 1

## 2023-02-04 NOTE — Progress Notes (Signed)
POSTPARTUM PROGRESS NOTE  Post Partum Day 1  Subjective:  Latasha Thompson is a 22 y.o. [id number] s/p SVD at [redacted]w[redacted]d.  She reports she is doing well. No acute events overnight. She denies any problems with ambulating, voiding or po intake. Denies nausea or vomiting.  Pain is well controlled.  Lochia is minimal.  Objective: Blood pressure 118/78, pulse 60, temperature 97.9 F (36.6 C), temperature source Oral, resp. rate 18, height 5\' 3"  (1.6 m), weight 79.7 kg, last menstrual period 04/06/2022, SpO2 99%, unknown if currently breastfeeding.  Physical Exam:  General: alert, cooperative and no distress Chest: no respiratory distress Heart:regular rate, distal pulses intact Uterine Fundus: firm, appropriately tender DVT Evaluation: No calf swelling or tenderness Extremities: No edema Skin: warm, dry  Recent Labs    02/03/23 0746 02/04/23 0430  HGB 10.7* 10.6*  HCT 33.0* 33.4*    Assessment/Plan: Latasha Thompson is a 22 y.o. [id number] s/p SVD at [redacted]w[redacted]d   PPD#1 - Doing well  Routine postpartum care  Retained placenta requiring manual removal - Cont Methergine series Contraception: Depo Feeding: Breast Dispo: Plan for discharge tomorrow.   LOS: 1 day   Sundra Aland, MD OB Fellow  02/04/2023, 8:21 AM

## 2023-02-04 NOTE — Lactation Note (Signed)
This note was copied from a baby's chart. Lactation Consultation Note  Patient Name: Latasha Thompson [id number] Date: 02/04/2023 Age:22 hours Reason for consult: Initial assessment;Term Experienced BF mom stated baby has been BF well w/o pain or difficulty latching. LC made suggestions on positioning and body alignment. Baby feeding well. Praised mom for good feedings. Reminded mom of newborn feeding habits, STS, I&O. Mom encouraged to feed baby 8-12 times/24 hours and with feeding cues.  Mom felt like she was doing well and didn't need any assistance. Will call if needed. Encouraged mom to call for questions or assistance if needed.  Maternal Data Has patient been taught Hand Expression?: Yes Does the patient have breastfeeding experience prior to this delivery?: Yes How long did the patient breastfeed?: 2 1/2 yrs. stopped right before she found out she was pregnant  Feeding    LATCH Score Latch: Grasps breast easily, tongue down, lips flanged, rhythmical sucking.  Audible Swallowing: A few with stimulation  Type of Nipple: Everted at rest and after stimulation  Comfort (Breast/Nipple): Soft / non-tender  Hold (Positioning): No assistance needed to correctly position infant at breast.  LATCH Score: 9   Lactation Tools Discussed/Used Tools: Pump;Flanges Flange Size: 21 Breast pump type: Manual Pump Education: Setup, frequency, and cleaning;Milk Storage Reason for Pumping: mom requested Pumping frequency: PRN  Interventions Interventions: Breast feeding basics reviewed;Adjust position;Support pillows;Position options;Hand pump;Education;LC Services brochure  Discharge    Consult Status Consult Status: PRN    Yolanda Huffstetler, Diamond Nickel 02/04/2023, 1:03 AM

## 2023-02-04 NOTE — Discharge Summary (Signed)
Postpartum Discharge Summary      Patient Name: Latasha Thompson DOB: 11/28/2000 MRN: [id number]  Date of admission: 02/03/2023 Delivery date:02/03/2023 Delivering provider: Dorathy Kinsman Date of discharge: 02/04/2023  Admitting diagnosis: PROM (premature rupture of membranes) [O42.90] Intrauterine pregnancy: [redacted]w[redacted]d     Secondary diagnosis:  Active Problems:   Vaginal delivery   Retained placenta or amniotic membrane after delivery without hemorrhage  Additional problems: none    Discharge diagnosis: Term Pregnancy Delivered                                              Post partum procedures: none Augmentation: N/A Complications: None  Hospital course: Onset of Labor With Vaginal Delivery      22 y.o. yo [id number] at [redacted]w[redacted]d was admitted in Active Labor on 02/03/2023. Labor course was uncomplicated  Membrane Rupture Time/Date: 3:00 AM,02/03/2023  Delivery Method:Vaginal, Spontaneous Operative Delivery:N/A Episiotomy: None Lacerations:  1st degree Patient had a postpartum course that was uncomplicated.  She is ambulating, tolerating a regular diet, passing flatus, and urinating well. Patient is discharged home in stable condition on 02/04/23.  Newborn Data: Birth date:02/03/2023 Birth time:4:45 PM Gender:Female Living status:Living Apgars:8 ,9  Weight:3290 g  Magnesium Sulfate received: No BMZ received: No Rhophylac:N/A MMR:N/A T-DaP: declined Flu: N/A RSV Vaccine received: No Transfusion:No  Immunizations received: Immunization History  Administered Date(s) Administered   DTaP 12/05/2000, 04/21/2001, 08/11/2001, 04/20/2002, 10/26/2004   HIB (PRP-OMP) 12/05/2000, 04/21/2001, 08/11/2001, 04/20/2002   Hepatitis A 10/11/2005, 10/10/2007   Hepatitis B 03-22-2000, 12/05/2000, 08/11/2001   Hpv-Unspecified 06/27/2017   IPV 12/05/2000, 04/21/2001, 04/20/2002, 10/26/2004   Influenza-Unspecified 11/14/2011, 12/03/2012, 12/10/2013   MMR 10/20/2001, 10/26/2004    Meningococcal B, OMV 06/27/2017   Meningococcal Mcv4o 09/23/2012, 06/27/2017   Pneumococcal Conjugate-13 12/05/2000, 04/21/2001, 08/11/2001   Tdap 07/10/2011, 11/15/2022   Varicella 10/20/2001, 10/11/2005    Physical exam  Vitals:   02/03/23 1950 02/03/23 2100 02/04/23 0100 02/04/23 0500  BP: 120/69 116/61 126/78 118/78  Pulse: 64 65 60 60  Resp: 18 18 18 18   Temp: 98.7 F (37.1 C) 97.8 F (36.6 C) 97.8 F (36.6 C) 97.9 F (36.6 C)  TempSrc: Oral Oral Oral Oral  SpO2: 99% 99% 98% 99%  Weight:      Height:       Exam done earlier by Dr Lucianne Muss. See her note.  Labs: Lab Results  Component Value Date   WBC 12.1 (H) 02/04/2023   HGB 10.6 (L) 02/04/2023   HCT 33.4 (L) 02/04/2023   MCV 90.8 02/04/2023   PLT 182 02/04/2023      Latest Ref Rng & Units 06/21/2022    6:00 PM  CMP  Glucose 70 - 99 mg/dL 324   BUN 6 - 20 mg/dL 11   Creatinine 4.01 - 1.00 mg/dL 0.27   Sodium 253 - 664 mmol/L 132   Potassium 3.5 - 5.1 mmol/L 3.5   Chloride 98 - 111 mmol/L 100   CO2 22 - 32 mmol/L 25   Calcium 8.9 - 10.3 mg/dL 8.9    Edinburgh Score:    02/04/2023    1:00 AM  Edinburgh Postnatal Depression Scale Screening Tool  I have been able to laugh and see the funny side of things. 0  I have looked forward with enjoyment to things. 0  I have blamed myself unnecessarily when things went  wrong. 1  I have been anxious or worried for no good reason. 1  I have felt scared or panicky for no good reason. 0  Things have been getting on top of me. 0  I have been so unhappy that I have had difficulty sleeping. 0  I have felt sad or miserable. 0  I have been so unhappy that I have been crying. 0  The thought of harming myself has occurred to me. 0  Edinburgh Postnatal Depression Scale Total 2   Edinburgh Postnatal Depression Scale Total: 2   After visit meds:  Allergies as of 02/04/2023   No Known Allergies      Medication List     STOP taking these medications    Blood Pressure  Monitor Misc       TAKE these medications    ibuprofen 600 MG tablet Commonly known as: ADVIL Take 1 tablet (600 mg total) by mouth every 6 (six) hours.   multivitamin-prenatal 27-0.8 MG Tabs tablet Take 1 tablet by mouth daily at 12 noon.         Discharge home in stable condition Infant Feeding: Breast Infant Disposition:home with mother Discharge instruction: per After Visit Summary and Postpartum booklet. Activity: Advance as tolerated. Pelvic rest for 6 weeks.  Diet: routine diet Future Appointments: Future Appointments  Date Time Provider Department Center  03/11/2023 10:30 AM Cheral Marker, CNM [facility]   Follow up Visit:   Please schedule this patient for a In person postpartum visit in 4 weeks with the following provider: Any provider. Additional Postpartum F/U: none   Low risk pregnancy complicated by:  none Delivery mode:  Vaginal, Spontaneous Anticipated Birth Control:  Depo   02/04/2023 Levie Heritage, DO

## 2023-02-04 NOTE — Anesthesia Postprocedure Evaluation (Signed)
Anesthesia Post Note  Patient: Latasha Thompson  Procedure(s) Performed: AN AD HOC LABOR EPIDURAL     Patient location during evaluation: Mother Baby Anesthesia Type: Epidural Level of consciousness: awake Pain management: satisfactory to patient Vital Signs Assessment: post-procedure vital signs reviewed and stable Respiratory status: spontaneous breathing Cardiovascular status: stable Anesthetic complications: no  No notable events documented.  Last Vitals:  Vitals:   02/04/23 0100 02/04/23 0500  BP: 126/78 118/78  Pulse: 60 60  Resp: 18 18  Temp: 36.6 C 36.6 C  SpO2: 98% 99%    Last Pain:  Vitals:   02/04/23 1120  TempSrc:   PainSc: 0-No pain   Pain Goal:                   [employer]

## 2023-02-04 NOTE — Social Work (Signed)
MOB was referred for history of depression/anxiety.  * Referral screened out by Clinical Social Worker because none of the following criteria appear to apply:  ~ History of anxiety/depression during this pregnancy, or of post-partum depression following prior delivery. Per chart review no noted hx of anxiety. Edinburgh=2  ~ Diagnosis of anxiety and/or depression within last 3 years OR * MOB's symptoms currently being treated with medication and/or therapy.  Please contact the Clinical Social Worker if needs arise, or by MOB request.   Wende Neighbors, LCSWA Clinical Social Worker [phone]

## 2023-02-07 ENCOUNTER — Encounter: Payer: Medicaid Other | Admitting: Advanced Practice Midwife

## 2023-02-11 ENCOUNTER — Telehealth (HOSPITAL_COMMUNITY): Payer: Self-pay

## 2023-02-11 DIAGNOSIS — S60454A Superficial foreign body of right ring finger, initial encounter: Secondary | ICD-10-CM | POA: Diagnosis not present

## 2023-02-11 DIAGNOSIS — M7989 Other specified soft tissue disorders: Secondary | ICD-10-CM | POA: Diagnosis not present

## 2023-02-11 NOTE — Telephone Encounter (Signed)
 02/11/2023 0945  Name: Latasha Thompson MRN: [id number] DOB: 04/14/2000  Reason for Call:  Transition of Care Hospital Discharge Call  Contact Status: Patient Contact Status: Complete  Language assistant needed:          Follow-Up Questions: Do You Have Any Concerns About Your Health As You Heal From Delivery?: No Do You Have Any Concerns About Your Infants Health?: No  Edinburgh Postnatal Depression Scale:  In the Past 7 Days: I have been able to laugh and see the funny side of things.: As much as I always could I have looked forward with enjoyment to things.: As much as I ever did I have blamed myself unnecessarily when things went wrong.: No, never I have been anxious or worried for no good reason.: No, not at all I have felt scared or panicky for no good reason.: No, not at all Things have been getting on top of me.: No, I have been coping as well as ever I have been so unhappy that I have had difficulty sleeping.: Not at all I have felt sad or miserable.: No, not at all I have been so unhappy that I have been crying.: No, never The thought of harming myself has occurred to me.: Never [place] Postnatal Depression Scale Total: 0  PHQ2-9 Depression Scale:     Discharge Follow-up: Edinburgh score requires follow up?: No Patient was advised of the following resources:: Breastfeeding Support Group, Support Group  Post-discharge interventions: Reviewed Newborn Safe Sleep Practices  Signature  Rosaline Deretha PEAK

## 2023-03-08 ENCOUNTER — Encounter: Payer: Self-pay | Admitting: Advanced Practice Midwife

## 2023-03-11 ENCOUNTER — Encounter: Payer: Self-pay | Admitting: Women's Health

## 2023-03-11 ENCOUNTER — Ambulatory Visit: Payer: Medicaid Other | Admitting: Women's Health

## 2023-03-11 DIAGNOSIS — Z3009 Encounter for other general counseling and advice on contraception: Secondary | ICD-10-CM

## 2023-03-11 DIAGNOSIS — Z1331 Encounter for screening for depression: Secondary | ICD-10-CM | POA: Diagnosis not present

## 2023-03-11 NOTE — Patient Instructions (Signed)

## 2023-03-11 NOTE — Progress Notes (Signed)
POSTPARTUM VISIT Patient name: Latasha Thompson MRN [id number]  Date of birth: 12/18/2000 Chief Complaint:   Postpartum Care  History of Present Illness:   Latasha Thompson is a 23 y.o. G6P2002 African American female being seen today for a postpartum visit. She is 5 weeks postpartum following a spontaneous vaginal delivery at 39.3 gestational weeks. IOL: no, for n/a. Anesthesia: epidural.  Laceration: 1st degree, not repaired.  Complications: none. Inpatient contraception: no.   Pregnancy uncomplicated. Tobacco use: no. Substance use disorder: no. Last pap smear: 09/19/22 and results were NILM w/ HRHPV not done. Next pap smear due: 2027 Patient's last menstrual period was 04/06/2022 (approximate).  Postpartum course has been uncomplicated. Bleeding none. Bowel function is normal. Bladder function is normal. Urinary incontinence? no, fecal incontinence? no Patient is not sexually active. Last sexual activity: prior to birth of baby. Desired contraception: undecided, leaning towards depo, but wants to do more research. Patient does not know want a pregnancy in the future.  Desired family size is maybe 3 children.   Upstream - 03/11/23 1031       Pregnancy Intention Screening   Does the patient want to become pregnant in the next year? No    Does the patient's partner want to become pregnant in the next year? No    Would the patient like to discuss contraceptive options today? Yes      Contraception Wrap Up   Current Method --   want bc, not sure   End Method --   wanr bc, not sure   Contraception Counseling Provided Yes            The pregnancy intention screening data noted above was reviewed. Potential methods of contraception were discussed. The patient elected to proceed with -- (wanr bc, not sure).  Edinburgh Postpartum Depression Screening: negative  Edinburgh Postnatal Depression Scale - 03/11/23 1027       Edinburgh Postnatal Depression Scale:  In the Past 7 Days   I have  been able to laugh and see the funny side of things. 0    I have looked forward with enjoyment to things. 1    I have blamed myself unnecessarily when things went wrong. 0    I have been anxious or worried for no good reason. 0    I have felt scared or panicky for no good reason. 0    Things have been getting on top of me. 0    I have been so unhappy that I have had difficulty sleeping. 0    I have felt sad or miserable. 0    I have been so unhappy that I have been crying. 0    The thought of harming myself has occurred to me. 0    Edinburgh Postnatal Depression Scale Total 1                07/30/2022    3:23 PM 02/16/2022    2:07 PM 12/18/2019    9:03 AM 09/08/2019   11:06 AM  GAD 7 : Generalized Anxiety Score  Nervous, Anxious, on Edge 0 0 0 1  Control/stop worrying 1 1 0 0  Worry too much - different things 1 1 0 0  Trouble relaxing 1 0 0 0  Restless 0 0 0 0  Easily annoyed or irritable 2 1 0 0  Afraid - awful might happen 0 0 0 0  Total GAD 7 Score 5 3 0 1  Anxiety Difficulty  Not  difficult at all       Baby's course has been uncomplicated. Baby is feeding by breast: milk supply adequate. Infant has a pediatrician/family doctor? Yes.  Childcare strategy if returning to work/school: n/a-stay at home mom.  Pt has material needs met for her and baby: Yes.   Review of Systems:   Pertinent items are noted in HPI Denies Abnormal vaginal discharge w/ itching/odor/irritation, headaches, visual changes, shortness of breath, chest pain, abdominal pain, severe nausea/vomiting, or problems with urination or bowel movements. Pertinent History Reviewed:  Reviewed past medical,surgical, obstetrical and family history.  Reviewed problem list, medications and allergies. OB History  Gravida Para Term Preterm AB Living  2 2 2   2   SAB IAB Ectopic Multiple Live Births     0 2    # Outcome Date GA Lbr Len/2nd Weight Sex Type Anes PTL Lv  2 Term 02/03/23 [redacted]w[redacted]d 13:28 / 00:17 7 lb 4.1 oz  (3.29 kg) F Vag-Spont EPI  LIV  1 Term 03/21/20 [redacted]w[redacted]d 17:38 / 00:15 6 lb 13.9 oz (3.116 kg) M Vag-Spont None N LIV   Physical Assessment:   Vitals:   03/11/23 1024  BP: 114/69  Pulse: 72  Weight: 158 lb (71.7 kg)  Height: 5\' 4"  (1.626 m)  Body mass index is 27.12 kg/m.       Physical Examination:   General appearance: alert, well appearing, and in no distress  Mental status: alert, oriented to person, place, and time  Skin: warm & dry   Cardiovascular: normal heart rate noted   Respiratory: normal respiratory effort, no distress   Breasts: deferred, no complaints   Abdomen: soft, non-tender   Pelvic: examination not indicated. Thin prep pap obtained: No  Rectal: not examined  Extremities: Edema: none   Chaperone: N/A         No results found for this or any previous visit (from the past 24 hours).  Assessment & Plan:  1) Postpartum exam 2) 5 wks s/p spontaneous vaginal delivery 3) breast feeding 4) Depression screening 5) Contraception counseling> leaning towards depo, will let us know  Essential components of care per ACOG recommendations:  1.  Mood and well being:  If positive depression screen, discussed and plan developed.  If using tobacco we discussed reduction/cessation and risk of relapse If current substance abuse, we discussed and referral to local resources was offered.   2. Infant care and feeding:  If breastfeeding, discussed returning to work, pumping, breastfeeding-associated pain, guidance regarding return to fertility while lactating if not using another method. If needed, patient was provided with a letter to be allowed to pump q 2-3hrs to support lactation in a private location with access to a refrigerator to store breastmilk.   Recommended that all caregivers be immunized for flu, pertussis and other preventable communicable diseases If pt does not have material needs met for her/baby, referred to local resources for help obtaining these.  3.  Sexuality, contraception and birth spacing Provided guidance regarding sexuality, management of dyspareunia, and resumption of intercourse Discussed avoiding interpregnancy interval <1mths and recommended birth spacing of 18 months  4. Sleep and fatigue Discussed coping options for fatigue and sleep disruption Encouraged family/partner/community support of 4 hrs of uninterrupted sleep to help with mood and fatigue  5. Physical recovery  If pt had a C/S, assessed incisional pain and providing guidance on normal vs prolonged recovery If pt had a laceration, perineal healing and pain reviewed.  If urinary or fecal incontinence, discussed management  and referred to PT or uro/gyn if indicated  Patient is safe to resume physical activity. Discussed attainment of healthy weight.  6.  Chronic disease management Discussed pregnancy complications if any, and their implications for future childbearing and long-term maternal health. Review recommendations for prevention of recurrent pregnancy complications, such as 17 hydroxyprogesterone caproate to reduce risk for recurrent PTB not applicable, or aspirin to reduce risk of preeclampsia not applicable. Pt had GDM: no. If yes, 2hr GTT scheduled: not applicable. Reviewed medications and non-pregnant dosing including consideration of whether pt is breastfeeding using a reliable resource such as LactMed: not applicable Referred for f/u w/ PCP or subspecialist providers as indicated: not applicable  7. Health maintenance Mammogram at 23yo or earlier if indicated Pap smears as indicated  Meds: No orders of the defined types were placed in this encounter.   Follow-up: Return in about 1 year (around 03/10/2024) for Physical.   No orders of the defined types were placed in this encounter.   Cheral Marker CNM, [facility] 03/11/2023 10:58 AM

## 2023-06-26 ENCOUNTER — Encounter: Payer: Self-pay | Admitting: Advanced Practice Midwife

## 2023-07-31 ENCOUNTER — Encounter: Payer: Self-pay | Admitting: Advanced Practice Midwife

## 2023-08-19 ENCOUNTER — Encounter: Payer: Self-pay | Admitting: Women's Health

## 2023-08-19 ENCOUNTER — Ambulatory Visit: Admitting: Women's Health

## 2023-08-19 ENCOUNTER — Other Ambulatory Visit (HOSPITAL_COMMUNITY)
Admission: RE | Admit: 2023-08-19 | Discharge: 2023-08-19 | Disposition: A | Discharge status: Home / Self Care | Source: Ambulatory Visit | Attending: Women's Health | Admitting: Women's Health

## 2023-08-19 VITALS — BP 109/68 | HR 85 | Ht 64.0 in | Wt 162.6 lb

## 2023-08-19 DIAGNOSIS — N926 Irregular menstruation, unspecified: Secondary | ICD-10-CM | POA: Insufficient documentation

## 2023-08-19 NOTE — Progress Notes (Signed)
 GYN VISIT Patient name: Latasha Thompson MRN [id number]  Date of birth: Apr 22, 2000 Chief Complaint:   Medication Consultation (Birth control)  History of Present Illness:   Latasha Thompson is a 23 y.o. [id number] African-American female being seen today for report of regular period monthly x 4-5d, stops x 3-4d then starts back for 2 days since having baby Dec 2024. Breastfeeding. Denies abnormal discharge, itching/odor/irritation.  Not sure if she wants to go on birth control or not, just wants to know options. Is concerned about weight gain, mood swings, but wants something to regulate cycles.     Patient's last menstrual period was 08/18/2023 (exact date). Last pap 09/19/22. Results were: NILM w/ HRHPV not done     11/15/2022    9:22 AM 07/30/2022    3:22 PM 02/16/2022    2:07 PM 12/18/2019    9:03 AM 09/08/2019   11:06 AM  Depression screen PHQ 2/9  Decreased Interest 0 0 0 1 2  Down, Depressed, Hopeless 0 0 0 0 0  PHQ - 2 Score 0 0 0 1 2  Altered sleeping 0 2 1 0 0  Tired, decreased energy 0 2 0 0 1  Change in appetite 0 2 0 0 0  Feeling bad or failure about yourself  0 0 0 0 0  Trouble concentrating 0 0 0 0 0  Moving slowly or fidgety/restless 0 0 0 0 0  Suicidal thoughts 0 0 0 0 0  PHQ-9 Score 0 6 1 1 3   Difficult doing work/chores Not difficult at all  Not difficult at all          07/30/2022    3:23 PM 02/16/2022    2:07 PM 12/18/2019    9:03 AM 09/08/2019   11:06 AM  GAD 7 : Generalized Anxiety Score  Nervous, Anxious, on Edge 0 0 0 1  Control/stop worrying 1 1 0 0  Worry too much - different things 1 1 0 0  Trouble relaxing 1 0 0 0  Restless 0 0 0 0  Easily annoyed or irritable 2 1 0 0  Afraid - awful might happen 0 0 0 0  Total GAD 7 Score 5 3 0 1  Anxiety Difficulty  Not difficult at all       Review of Systems:   Pertinent items are noted in HPI Denies fever/chills, dizziness, headaches, visual disturbances, fatigue, shortness of breath, chest pain, abdominal  pain, vomiting, abnormal vaginal discharge/itching/odor/irritation, problems with periods, bowel movements, urination, or intercourse unless otherwise stated above.  Pertinent History Reviewed:  Reviewed past medical,surgical, social, obstetrical and family history.  Reviewed problem list, medications and allergies. Physical Assessment:   Vitals:   08/19/23 1621  BP: 109/68  Pulse: 85  Weight: 162 lb 9.6 oz (73.8 kg)  Height: 5' 4 (1.626 m)  Body mass index is 27.91 kg/m.       Physical Examination:   General appearance: alert, well appearing, and in no distress  Mental status: alert, oriented to person, place, and time  Skin: warm & dry   Cardiovascular: normal heart rate noted  Respiratory: normal respiratory effort, no distress  Abdomen: soft, non-tender   Pelvic: self swab by pt  Extremities: no edema   Chaperone: N/A  No results found for this or any previous visit (from the past 24 hours).  Assessment & Plan:  1) Irregular cycles> self-collected CV swab, if neg may be interested in birth control to regulate. Is breastfeeding, discussed options,  concerned about weight gain/mood swings, will research and let us  know what she wants  Meds: No orders of the defined types were placed in this encounter.   No orders of the defined types were placed in this encounter.   Return in about 1 year (around 08/18/2024) for Physical.  Suzen JONELLE Fetters CNM, [facility] 08/19/2023 4:50 PM

## 2023-08-21 ENCOUNTER — Ambulatory Visit: Payer: Self-pay | Admitting: Women's Health

## 2023-08-21 ENCOUNTER — Encounter: Payer: Self-pay | Admitting: Family Medicine

## 2023-08-21 LAB — CERVICOVAGINAL ANCILLARY ONLY
Bacterial Vaginitis (gardnerella): POSITIVE — AB
Candida Glabrata: NEGATIVE
Candida Vaginitis: NEGATIVE
Chlamydia: NEGATIVE
Comment: NEGATIVE
Comment: NEGATIVE
Comment: NEGATIVE
Comment: NEGATIVE
Comment: NEGATIVE
Comment: NORMAL
Neisseria Gonorrhea: NEGATIVE
Trichomonas: NEGATIVE

## 2023-08-21 MED ORDER — METRONIDAZOLE 0.75 % VA GEL: 1.0000 | Freq: Every day | VAGINAL | 0 refills | Status: DC

## 2023-08-21 NOTE — Telephone Encounter (Signed)
 Wait for BV results then follow up with GYN

## 2023-10-03 ENCOUNTER — Encounter: Payer: Self-pay | Admitting: Women's Health

## 2023-10-14 ENCOUNTER — Ambulatory Visit (INDEPENDENT_AMBULATORY_CARE_PROVIDER_SITE_OTHER): Admitting: *Deleted

## 2023-10-14 ENCOUNTER — Other Ambulatory Visit (HOSPITAL_COMMUNITY)
Admission: RE | Admit: 2023-10-14 | Discharge: 2023-10-14 | Disposition: A | Discharge status: Home / Self Care | Source: Ambulatory Visit | Attending: Obstetrics & Gynecology | Admitting: Obstetrics & Gynecology

## 2023-10-14 DIAGNOSIS — R300 Dysuria: Secondary | ICD-10-CM | POA: Insufficient documentation

## 2023-10-14 LAB — POCT URINALYSIS DIPSTICK OB
Blood, UA: NEGATIVE
Glucose, UA: NEGATIVE
Ketones, UA: NEGATIVE
Leukocytes, UA: NEGATIVE
Nitrite, UA: NEGATIVE
POC,PROTEIN,UA: NEGATIVE

## 2023-10-14 NOTE — Addendum Note (Signed)
 Addended by: ILEAN RUTHERFORD HERO on: 10/14/2023 02:53 PM   Modules accepted: Orders

## 2023-10-14 NOTE — Progress Notes (Signed)
   NURSE VISIT- UTI SYMPTOMS   SUBJECTIVE:  Latasha Thompson is a 23 y.o. G6P2002 female here for UTI symptoms. She is a GYN patient. She reports dysuria. States she has had similar symptoms when she had BV but denies any discharge, odor, irritation. Requesting swab as well.   OBJECTIVE:  There were no vitals taken for this visit.  Appears well, in no apparent distress  Results for orders placed or performed in visit on 10/14/23 (from the past 24 hours)  POC Urinalysis Dipstick OB   Collection Time: 10/14/23  2:46 PM  Result Value Ref Range   Color, UA     Clarity, UA     Glucose, UA Negative Negative   Bilirubin, UA     Ketones, UA neg    Spec Grav, UA     Blood, UA neg    pH, UA     POC,PROTEIN,UA Negative Negative, Trace, Small (1+), Moderate (2+), Large (3+), 4+   Urobilinogen, UA     Nitrite, UA neg    Leukocytes, UA Negative Negative   Appearance     Odor      ASSESSMENT: GYN patient with UTI symptoms and negative nitrites  PLAN: Note routed to Delon Lewis, AGNP   Rx sent by provider today: No Urine culture sent Call or return to clinic prn if these symptoms worsen or fail to improve as anticipated. Follow-up: as needed   Tamotsu Wiederholt  10/14/2023 2:49 PM

## 2023-10-15 LAB — URINALYSIS, ROUTINE W REFLEX MICROSCOPIC
Bilirubin, UA: NEGATIVE
Glucose, UA: NEGATIVE
Ketones, UA: NEGATIVE
Leukocytes,UA: NEGATIVE
Nitrite, UA: NEGATIVE
RBC, UA: NEGATIVE
Specific Gravity, UA: >=1.03 — AB (ref 1.005–1.030)
Urobilinogen, Ur: 0.2 mg/dL (ref 0.2–1.0)
pH, UA: 6 (ref 5.0–7.5)

## 2023-10-16 ENCOUNTER — Ambulatory Visit: Payer: Self-pay | Admitting: Adult Health

## 2023-10-16 LAB — CERVICOVAGINAL ANCILLARY ONLY
Bacterial Vaginitis (gardnerella): NEGATIVE
Candida Glabrata: NEGATIVE
Candida Vaginitis: NEGATIVE
Chlamydia: NEGATIVE
Comment: NEGATIVE
Comment: NEGATIVE
Comment: NEGATIVE
Comment: NEGATIVE
Comment: NEGATIVE
Comment: NORMAL
Neisseria Gonorrhea: NEGATIVE
Trichomonas: NEGATIVE

## 2023-10-29 ENCOUNTER — Encounter: Payer: Self-pay | Admitting: Family Medicine

## 2024-01-22 ENCOUNTER — Telehealth: Admitting: Physician Assistant

## 2024-01-22 DIAGNOSIS — L2082 Flexural eczema: Secondary | ICD-10-CM | POA: Diagnosis not present

## 2024-01-22 MED ORDER — TRIAMCINOLONE ACETONIDE 0.1 % EX CREA: 1.0000 | TOPICAL_CREAM | Freq: Two times a day (BID) | CUTANEOUS | 0 refills | Status: DC

## 2024-01-22 NOTE — Progress Notes (Signed)

## 2024-01-28 ENCOUNTER — Encounter: Payer: Self-pay | Admitting: Women's Health

## 2024-01-28 ENCOUNTER — Other Ambulatory Visit: Payer: Self-pay | Admitting: Obstetrics and Gynecology

## 2024-01-28 DIAGNOSIS — N61 Mastitis without abscess: Secondary | ICD-10-CM

## 2024-01-28 MED ORDER — DICLOXACILLIN SODIUM 500 MG PO CAPS: 500.0000 mg | ORAL_CAPSULE | Freq: Four times a day (QID) | ORAL | 0 refills | Status: DC

## 2024-01-28 NOTE — Progress Notes (Signed)
 Rx for dicloxacillin

## 2024-02-27 ENCOUNTER — Ambulatory Visit: Admitting: Family Medicine

## 2024-02-27 ENCOUNTER — Encounter: Payer: Self-pay | Admitting: Family Medicine

## 2024-02-27 VITALS — BP 105/64 | HR 70 | Resp 18 | Ht 65.0 in | Wt 159.1 lb

## 2024-02-27 DIAGNOSIS — M779 Enthesopathy, unspecified: Secondary | ICD-10-CM

## 2024-02-27 DIAGNOSIS — M25532 Pain in left wrist: Secondary | ICD-10-CM | POA: Diagnosis not present

## 2024-02-27 DIAGNOSIS — M79671 Pain in right foot: Secondary | ICD-10-CM | POA: Diagnosis not present

## 2024-02-27 DIAGNOSIS — M778 Other enthesopathies, not elsewhere classified: Secondary | ICD-10-CM

## 2024-02-27 DIAGNOSIS — E559 Vitamin D deficiency, unspecified: Secondary | ICD-10-CM | POA: Diagnosis not present

## 2024-02-27 DIAGNOSIS — M2550 Pain in unspecified joint: Secondary | ICD-10-CM

## 2024-02-27 MED ORDER — MELOXICAM 7.5 MG PO TABS: ORAL_TABLET | ORAL | 0 refills | Status: AC

## 2024-02-27 MED ORDER — UNABLE TO FIND: 0 refills | Status: AC

## 2024-02-27 MED ORDER — PREDNISONE 5 MG PO TABS: 5.0000 mg | ORAL_TABLET | Freq: Two times a day (BID) | ORAL | 0 refills | Status: AC

## 2024-02-27 NOTE — Patient Instructions (Signed)
 F/U in 7  months   Take prednisone  one twice daily for 5 days, then next week take meloxicam  one dily for 5 days , then as needed  I will refer you to Orthopedics for furhter evaluation of left hand and right foot pain, the office will call  Nurse pls order left hand brace, dx is tendonitis left wrist  Labs today, cBC , bmp and EGFr and vit D  Thanks for choosing [facility], we consider it a privelige to serve you.

## 2024-02-28 ENCOUNTER — Ambulatory Visit: Payer: Self-pay | Admitting: Family Medicine

## 2024-02-28 ENCOUNTER — Telehealth: Payer: Self-pay

## 2024-02-28 LAB — BMP8+EGFR
BUN/Creatinine Ratio: 12 (ref 9–23)
BUN: 9 mg/dL (ref 6–20)
CO2: 21 mmol/L (ref 20–29)
Calcium: 9.4 mg/dL (ref 8.7–10.2)
Chloride: 104 mmol/L (ref 96–106)
Creatinine, Ser: 0.77 mg/dL (ref 0.57–1.00)
Glucose: 86 mg/dL (ref 70–99)
Potassium: 4.4 mmol/L (ref 3.5–5.2)
Sodium: 140 mmol/L (ref 134–144)
eGFR: 111 mL/min/1.73

## 2024-02-28 LAB — CBC WITH DIFFERENTIAL/PLATELET
Basophils Absolute: 0 x10E3/uL (ref 0.0–0.2)
Basos: 1 %
EOS (ABSOLUTE): 0.1 x10E3/uL (ref 0.0–0.4)
Eos: 2 %
Hematocrit: 39.5 % (ref 34.0–46.6)
Hemoglobin: 12.7 g/dL (ref 11.1–15.9)
Immature Grans (Abs): 0 x10E3/uL (ref 0.0–0.1)
Immature Granulocytes: 0 %
Lymphocytes Absolute: 2.4 x10E3/uL (ref 0.7–3.1)
Lymphs: 48 %
MCH: 29.9 pg (ref 26.6–33.0)
MCHC: 32.2 g/dL (ref 31.5–35.7)
MCV: 93 fL (ref 79–97)
Monocytes Absolute: 0.4 x10E3/uL (ref 0.1–0.9)
Monocytes: 8 %
Neutrophils Absolute: 2.1 x10E3/uL (ref 1.4–7.0)
Neutrophils: 41 %
Platelets: 271 x10E3/uL (ref 150–450)
RBC: 4.25 x10E6/uL (ref 3.77–5.28)
RDW: 11.9 % (ref 11.7–15.4)
WBC: 5.1 x10E3/uL (ref 3.4–10.8)

## 2024-02-28 LAB — VITAMIN D 25 HYDROXY (VIT D DEFICIENCY, FRACTURES): Vit D, 25-Hydroxy: 12.4 ng/mL — ABNORMAL LOW (ref 30.0–100.0)

## 2024-02-28 MED ORDER — VITAMIN D (ERGOCALCIFEROL) 1.25 MG (50000 UNIT) PO CAPS: 50000.0000 [IU] | ORAL_CAPSULE | ORAL | 2 refills | Status: AC

## 2024-02-28 NOTE — Telephone Encounter (Signed)
 Noted.

## 2024-02-28 NOTE — Telephone Encounter (Signed)
 Copied from CRM #[id number]. Topic: Clinical - Lab/Test Results >> Feb 28, 2024  2:03 PM Avram MATSU wrote: Reason for CRM: patient was made aware of lab results and was informed the provider would be sending a referral for ortho right foot and left wrist.

## 2024-03-02 DIAGNOSIS — M79671 Pain in right foot: Secondary | ICD-10-CM | POA: Insufficient documentation

## 2024-03-02 DIAGNOSIS — M778 Other enthesopathies, not elsewhere classified: Secondary | ICD-10-CM | POA: Insufficient documentation

## 2024-03-02 DIAGNOSIS — M25532 Pain in left wrist: Secondary | ICD-10-CM | POA: Insufficient documentation

## 2024-03-02 DIAGNOSIS — E559 Vitamin D deficiency, unspecified: Secondary | ICD-10-CM | POA: Insufficient documentation

## 2024-03-02 NOTE — Progress Notes (Signed)
" ° °  Latasha Thompson     MRN: [id number]      DOB: 01/26/2001  Chief Complaint  Patient presents with   Wrist Pain    Pt complains of lt wrist pain x1 week, no known injury    Foot Pain    Pt complains of rt foot pain and stiffness x1 week, pt feels like it is so stiff she cannot move it. No known injury     HPI [name] is here with above concerns   ROS Denies recent fever or chills. Denies sinus pressure, nasal congestion, ear pain or sore throat. Denies chest congestion, productive cough or wheezing. Denies chest pains, palpitations and leg swelling Denies skin break down or rash.   PE  BP 105/64   Pulse 70   Resp 18   Ht 5' 5 (1.651 m)   Wt 159 lb 1.9 oz (72.2 kg)   LMP 02/15/2024 (Exact Date)   SpO2 95%   BMI 26.48 kg/m   Patient alert and oriented and in no cardiopulmonary distress.  HEENT: No facial asymmetry, EOMI,     Neck supple .  Chest: Clear to auscultation bilaterally.  CVS: S1, S2 no murmurs, no S3.Regular rate.  ABD: Soft non tender.   Ext: No edema  MS: Adequate ROM spine, shoulders, hips and knees.Tender over extensor  tendon left thumb with mild swelling of tendon and mild limitation in movement of the thumb Tender over dorsum of right foot, no erythema or warmth, no limitation in movement of the ankle or foot  Skin: Intact, no ulcerations or rash noted.  Psych: Good eye contact, normal affect. Memory intact not anxious or depressed appearing.  CNS: CN 2-12 intact, power,  normal throughout.no focal deficits noted.   Assessment & Plan  Tendonitis of wrist, left Prednisone  x 5 dy   Thumb tendonitis 5 day course of prednisone  followed by as needed meloxicam  7.5 mg daily for 1 week,  if persists refer Ortho  Left wrist pain Brace for left wrist prescribed bedtime and /or use all day, keeping it dry and clean  Foot pain, right Acute onset, no trauma, short course of prednisone  followed by as needed meloxicam , Ortho eval if persists or  worsens  "

## 2024-03-02 NOTE — Assessment & Plan Note (Signed)
 Prednisone  x 5 dy

## 2024-03-02 NOTE — Assessment & Plan Note (Signed)
 Acute onset, no trauma, short course of prednisone  followed by as needed meloxicam , Ortho eval if persists or worsens

## 2024-03-02 NOTE — Assessment & Plan Note (Signed)
 Brace for left wrist prescribed bedtime and /or use all day, keeping it dry and clean

## 2024-03-02 NOTE — Assessment & Plan Note (Addendum)
 5 day course of prednisone  followed by as needed meloxicam  7.5 mg daily for 1 week,  if persists refer Ortho

## 2024-09-23 ENCOUNTER — Ambulatory Visit: Payer: Self-pay | Admitting: Nurse Practitioner
